# Patient Record
Sex: Male | Born: 1970 | Race: Black or African American | Hispanic: No | Marital: Married | Smoking: Current every day smoker
Health system: Southern US, Community
[De-identification: ages and names within clinical notes are randomized; demographics above are authoritative.]

## PROBLEM LIST (undated history)

## (undated) DIAGNOSIS — I10 Essential (primary) hypertension: Secondary | ICD-10-CM

## (undated) DIAGNOSIS — E785 Hyperlipidemia, unspecified: Secondary | ICD-10-CM

## (undated) DIAGNOSIS — G35 Multiple sclerosis: Secondary | ICD-10-CM

## (undated) HISTORY — DX: Essential (primary) hypertension: I10

## (undated) HISTORY — DX: Hyperlipidemia, unspecified: E78.5

---

## 2010-04-07 ENCOUNTER — Emergency Department: Payer: Self-pay | Admitting: Internal Medicine

## 2011-08-10 ENCOUNTER — Ambulatory Visit: Payer: Self-pay | Admitting: Family Medicine

## 2014-06-23 ENCOUNTER — Emergency Department: Payer: Self-pay | Admitting: Internal Medicine

## 2014-10-06 ENCOUNTER — Encounter: Payer: Self-pay | Admitting: Family Medicine

## 2014-10-06 ENCOUNTER — Ambulatory Visit (INDEPENDENT_AMBULATORY_CARE_PROVIDER_SITE_OTHER): Payer: BC Managed Care – PPO | Admitting: Family Medicine

## 2014-10-06 VITALS — BP 124/82 | HR 89 | Temp 98.3°F | Resp 16 | Ht 70.0 in | Wt 244.6 lb

## 2014-10-06 DIAGNOSIS — E1129 Type 2 diabetes mellitus with other diabetic kidney complication: Secondary | ICD-10-CM

## 2014-10-06 DIAGNOSIS — E669 Obesity, unspecified: Secondary | ICD-10-CM | POA: Diagnosis not present

## 2014-10-06 DIAGNOSIS — R809 Proteinuria, unspecified: Secondary | ICD-10-CM

## 2014-10-06 LAB — POCT GLYCOSYLATED HEMOGLOBIN (HGB A1C): Hemoglobin A1C: 4.8

## 2014-10-06 LAB — GLUCOSE, POCT (MANUAL RESULT ENTRY): POC Glucose: 102 mg/dl — AB (ref 70–99)

## 2014-10-06 MED ORDER — METFORMIN HCL 500 MG PO TABS: 500.0000 mg | ORAL_TABLET | Freq: Every day | ORAL | Status: DC

## 2014-10-06 NOTE — Progress Notes (Signed)
Name: Bill Perez   MRN: [id number]    DOB: 05/06/1970   Date:10/06/2014       Progress Note  Subjective  Chief Complaint  Chief Complaint  Patient presents with  . Diabetes    Diabetes He presents for his follow-up diabetic visit. He has type 2 diabetes mellitus. His disease course has been improving. Pertinent negatives for hypoglycemia include no dizziness, headaches, nervousness/anxiousness, seizures or tremors. Pertinent negatives for diabetes include no blurred vision, no chest pain, no polydipsia, no polyuria, no weakness and no weight loss. Diabetic complications include nephropathy. Risk factors for coronary artery disease include diabetes mellitus, hypertension, stress and obesity. Current diabetic treatment includes oral agent (monotherapy). He is compliant with treatment all of the time. His weight is decreasing steadily. He is following a generally unhealthy diet. When asked about meal planning, he reported none. He has not had a previous visit with a dietitian. He participates in exercise weekly. His breakfast blood glucose range is generally 90-110 mg/dl. An ACE inhibitor/angiotensin II receptor blocker is not being taken. He does not see a podiatrist.Eye exam is not current.    Obesity  Patient's had a long-standing problem with obesity. This is the primary reason apparently for his diabetes. This is much improved.   Past Medical History  Diagnosis Date  . Diabetes mellitus without complication   . Hyperlipidemia     History  Substance Use Topics  . Smoking status: Current Every Day Smoker  . Smokeless tobacco: Not on file  . Alcohol Use: 0.0 oz/week    0 Standard drinks or equivalent per week     Current outpatient prescriptions:  .  metFORMIN (GLUCOPHAGE) 500 MG tablet, , Disp: , Rfl: 0 .  ONE TOUCH ULTRA TEST test strip, , Disp: , Rfl: 0 .  ONETOUCH DELICA LANCETS 33G MISC, , Disp: , Rfl: 0  No Known Allergies  Review of Systems  Constitutional:  Negative for fever, chills and weight loss.  HENT: Negative for congestion, hearing loss, sore throat and tinnitus.   Eyes: Negative for blurred vision, double vision and redness.  Respiratory: Negative for cough, hemoptysis and shortness of breath.   Cardiovascular: Negative for chest pain, palpitations, orthopnea, claudication and leg swelling.  Gastrointestinal: Negative for heartburn, nausea, vomiting, diarrhea, constipation and blood in stool.  Genitourinary: Negative for dysuria, urgency, frequency and hematuria.  Musculoskeletal: Negative for myalgias, back pain, joint pain, falls and neck pain.  Skin: Negative for itching.  Neurological: Negative for dizziness, tingling, tremors, focal weakness, seizures, loss of consciousness, weakness and headaches.  Endo/Heme/Allergies: Negative for polydipsia. Does not bruise/bleed easily.  Psychiatric/Behavioral: Negative for depression and substance abuse. The patient is not nervous/anxious and does not have insomnia.      Objective  Filed Vitals:   10/06/14 1113  BP: 124/82  Pulse: 89  Temp: 98.3 F (36.8 C)  Resp: 16  Height:  (1.778 m)  Weight: 244 lb 9 oz (110.933 kg)  SpO2: 98%     Physical Exam  Constitutional: He is oriented to person, place, and time and well-developed, well-nourished, and in no distress.  HENT:  Head: Normocephalic.  Eyes: EOM are normal. Pupils are equal, round, and reactive to light.  Neck: Normal range of motion. Neck supple. No thyromegaly present.  Cardiovascular: Normal rate, regular rhythm and normal heart sounds.   No murmur heard. Pulmonary/Chest: Effort normal and breath sounds normal. No respiratory distress. He has no wheezes.  Abdominal: Soft. Bowel sounds are normal.  Musculoskeletal: Normal range of motion. He exhibits no edema.  Lymphadenopathy:    He has no cervical adenopathy.  Neurological: He is alert and oriented to person, place, and time. No cranial nerve deficit. Gait  normal. Coordination normal.  Skin: Skin is warm and dry. No rash noted.  Psychiatric: Affect and judgment normal.      Assessment & Plan  1. Type 2 diabetes mellitus with other diabetic kidney complication Greatly improved Refer to diabetic teaching nurse. Patient may be having a honeymoon period after the initial diagnosis of diabetes. We will therefore decrease his Glucophage from 500 mg twice a day to once daily and reassess in 1 month - POCT HgB A1C - POCT Glucose (CBG)  2. Obesity Slightly improved

## 2014-10-06 NOTE — Patient Instructions (Signed)
FU in 3 mo

## 2014-10-15 ENCOUNTER — Encounter: Payer: Self-pay | Admitting: *Deleted

## 2014-10-15 ENCOUNTER — Encounter: Payer: BC Managed Care – PPO | Attending: Family Medicine | Admitting: *Deleted

## 2014-10-15 VITALS — BP 124/90 | Ht 70.0 in | Wt 244.9 lb

## 2014-10-15 DIAGNOSIS — E119 Type 2 diabetes mellitus without complications: Secondary | ICD-10-CM | POA: Diagnosis present

## 2014-10-15 NOTE — Progress Notes (Signed)
Diabetes Self-Management Education  Visit Type: First/Initial  Appt. Start Time: 1405 Appt. End Time: 1505  10/15/2014  [name], identified by name and date of birth, is a 44 y.o. male with a diagnosis of Diabetes: Type 2.    ASSESSMENT Blood pressure 124/90, height  (1.778 m), weight 244 lb 14.4 oz (111.086 kg). Body mass index is 35.14 kg/(m^2).  Initial Visit Information: Are you currently following a meal plan?: Yes What type of meal plan do you follow?: limit strachy food; eat light meals Are you taking your medications as prescribed?: Yes Are you checking your feet?: No How often do you need to have someone help you when you read instructions, pamphlets, or other written materials from your doctor or pharmacy?: 1 - Never What is the last grade level you completed in school?: College 4 years  Psychosocial: Patient Belief/Attitude about Diabetes: Motivated to manage diabetes ("feel normal") Self-care barriers: None Self-management support: Friends, Doctor's office Patient Concerns: Nutrition/Meal planning, Medication, Monitoring, Healthy Lifestyle, Problem Solving, Glycemic Control, Weight Control Special Needs: None Preferred Learning Style: Auditory, Visual, Hands on Learning Readiness: Change in progress  Complications:  Last HgB A1C per patient/outside source: 9 mg/dL (Pt reports reading in March. Recent one was 4.8 % in MD office on 10/06/14) How often do you check your blood sugar?: 1-2 times/day (weekly) Fasting Blood glucose range (mg/dL): 40-981 Postprandial Blood glucose range (mg/dL): 19-147 Have you had a dilated eye exam in the past 12 months?: No ("never") Have you had a dental exam in the past 12 months?: Yes (08/2014)  Diet Intake: Breakfast: bacon and apple Snack (morning): apple Lunch: sandwich or grilled chicken or burger Snack (afternoon): chips Dinner: meat and vegetables, greens Snack (evening): fruit Beverage(s): water; drank 1  soda in last week  Exercise: Exercise: Light (walking) Light Exercise amount of time (min / week): 120  Individualized Plan for Diabetes Self-Management Training:  Learning Objective:  Patient will have a greater understanding of diabetes self-management.  Education Topics Reviewed with Patient Today: Definition of diabetes, type 1 and 2, and the diagnosis of diabetes, Factors that contribute to the development of diabetes, Explored patient's options for treatment of their diabetes Food label reading, portion sizes and measuring food., Role of diet in the treatment of diabetes and the relationship between the three main macronutrients and blood glucose level Role of exercise on diabetes management, blood pressure control and cardiac health. Reviewed patients medication for diabetes, action, purpose, timing of dose and side effects. Purpose and frequency of SMBG., Identified appropriate SMBG and/or A1C goals. Relationship between chronic complications and blood glucose control, Retinopathy and reason for yearly dilated eye exams Identified and addressed patients feelings and concerns about diabetes Review risk of smoking and offered smoking cessation  PATIENTS GOALS/Plan (Developed by the patient): Improve blood sugars Decrease medications Prevent diabetes complications Lose weight Lead a healthier lifestyle Become more fit Quit smoking  Plan:   Patient Instructions  Check blood sugars weekly per MD Exercise: Continue walking for  30  minutes  4 days a week and increase to 5 days a week Avoid sugar sweetened drinks (soda) Eat 3 meals day,  1-2 snacks a day Space meals 4-6 hours apart Make an eye doctor appointment Bring blood sugar records to MD appts Call back for classes or 1:1 appointment with a dietitian if needed  Expected Outcomes:  Demonstrated interest in learning. Expect positive outcomes  Education material provided:  General Meal Planning Guidelines  If problems  or questions, patient to contact team via:  Sharion Settler, RN, CCM, CDE [phone]  Future DSME appointment:  Patient doesn't want to return at this time for further education. He reports doing better and feels like he can manage on his own.

## 2014-10-15 NOTE — Patient Instructions (Addendum)
Check blood sugars weekly per MD Exercise: Continue walking for  30  minutes  4 days a week and increase to 5 days a week Avoid sugar sweetened drinks (soda) Eat 3 meals day,  1-2 snacks a day Space meals 4-6 hours apart Make an eye doctor appointment Bring blood sugar records to MD appts Call back for classes or 1:1 appointment with a dietitian if needed

## 2015-01-06 ENCOUNTER — Ambulatory Visit: Payer: BC Managed Care – PPO | Admitting: Family Medicine

## 2015-01-13 ENCOUNTER — Encounter: Payer: Self-pay | Admitting: Family Medicine

## 2015-01-13 ENCOUNTER — Ambulatory Visit (INDEPENDENT_AMBULATORY_CARE_PROVIDER_SITE_OTHER): Payer: BC Managed Care – PPO | Admitting: Family Medicine

## 2015-01-13 VITALS — BP 118/82 | HR 88 | Temp 98.7°F | Resp 16 | Ht 70.0 in | Wt 247.2 lb

## 2015-01-13 DIAGNOSIS — J0110 Acute frontal sinusitis, unspecified: Secondary | ICD-10-CM | POA: Diagnosis not present

## 2015-01-13 MED ORDER — AMOXICILLIN-POT CLAVULANATE 875-125 MG PO TABS: 1.0000 | ORAL_TABLET | Freq: Two times a day (BID) | ORAL | Status: DC

## 2015-01-13 NOTE — Progress Notes (Signed)
Name: Bill Perez   MRN: [id number]    DOB: Oct 08, 1970   Date:01/13/2015       Progress Note  Subjective  Chief Complaint  Chief Complaint  Patient presents with  . Sinusitis    facial presssure for last 3 days    Sinusitis This is a new problem. The current episode started in the past 7 days (3 days ago). There has been no fever. The pain is moderate. Associated symptoms include sinus pressure (pressure over the right eye) and a sore throat. Pertinent negatives include no chills, congestion, coughing or ear pain. Past treatments include oral decongestants (Alka-Seltzer.). The treatment provided moderate relief.   Past Medical History  Diagnosis Date  . Diabetes mellitus without complication (HCC)   . Hyperlipidemia     No past surgical history on file.  Family History  Problem Relation Age of Onset  . Hypertension Mother   . Hyperlipidemia Mother   . Stroke Father   . Diabetes Father     Social History   Social History  . Marital Status: Single    Spouse Name: N/A  . Number of Children: N/A  . Years of Education: N/A   Occupational History  . Not on file.   Social History Main Topics  . Smoking status: Current Every Day Smoker -- 0.50 packs/day for 25 years    Types: Cigarettes  . Smokeless tobacco: Never Used  . Alcohol Use: 2.4 oz/week    4 Glasses of wine per week  . Drug Use: Yes  . Sexual Activity: Not on file   Other Topics Concern  . Not on file   Social History Narrative     Current outpatient prescriptions:  .  aspirin 81 MG tablet, Take 81 mg by mouth daily., Disp: , Rfl:  .  metFORMIN (GLUCOPHAGE) 500 MG tablet, Take 1 tablet (500 mg total) by mouth daily with breakfast. (Patient taking differently: Take 250 mg by mouth daily with breakfast. ), Disp: 30 tablet, Rfl: 5 .  ONE TOUCH ULTRA TEST test strip, , Disp: , Rfl: 0 .  ONETOUCH DELICA LANCETS 33G MISC, , Disp: , Rfl: 0  No Known Allergies   Review of Systems  Constitutional:  Negative for fever and chills.  HENT: Positive for sinus pressure (pressure over the right eye) and sore throat. Negative for congestion and ear pain.   Respiratory: Negative for cough and sputum production.   Cardiovascular: Negative for chest pain.   Objective  Filed Vitals:   01/13/15 1044  BP: 118/82  Pulse: 88  Temp: 98.7 F (37.1 C)  Resp: 16  Height: 5\' 10"  (1.778 m)  Weight: 247 lb 4 oz (112.152 kg)  SpO2: 98%   Physical Exam  Constitutional: He is well-developed, well-nourished, and in no distress.  HENT:  Right Ear: Tympanic membrane and ear canal normal.  Left Ear: Tympanic membrane and ear canal normal.  Nose: Mucosal edema present. No sinus tenderness. Right sinus exhibits frontal sinus tenderness. Left sinus exhibits frontal sinus tenderness.  Mouth/Throat: No posterior oropharyngeal erythema.  Cardiovascular: Normal rate and regular rhythm.   Pulmonary/Chest: Effort normal and breath sounds normal.  Nursing note and vitals reviewed.  Assessment & Plan  1. Acute non-recurrent frontal sinusitis  - amoxicillin-clavulanate (AUGMENTIN) 875-125 MG tablet; Take 1 tablet by mouth 2 (two) times daily.  Dispense: 20 tablet; Refill: 0   Shamiya Demeritt Asad A[facility] 01/13/2015 11:25 AM

## 2015-03-02 ENCOUNTER — Ambulatory Visit: Payer: BC Managed Care – PPO | Admitting: Family Medicine

## 2015-03-03 ENCOUNTER — Encounter: Payer: Self-pay | Admitting: Family Medicine

## 2015-03-03 ENCOUNTER — Ambulatory Visit (INDEPENDENT_AMBULATORY_CARE_PROVIDER_SITE_OTHER): Payer: BC Managed Care – PPO | Admitting: Family Medicine

## 2015-03-03 VITALS — BP 122/68 | HR 92 | Temp 97.9°F | Resp 18 | Ht 70.0 in | Wt 246.4 lb

## 2015-03-03 DIAGNOSIS — E1169 Type 2 diabetes mellitus with other specified complication: Secondary | ICD-10-CM

## 2015-03-03 LAB — GLUCOSE, POCT (MANUAL RESULT ENTRY): POC GLUCOSE: 102 mg/dL — AB (ref 70–99)

## 2015-03-03 LAB — POCT GLYCOSYLATED HEMOGLOBIN (HGB A1C): HEMOGLOBIN A1C: 4.8

## 2015-03-03 MED ORDER — LISINOPRIL 5 MG PO TABS: 10.0000 mg | ORAL_TABLET | Freq: Every day | ORAL | Status: DC

## 2015-03-03 MED ORDER — FLUTICASONE PROPIONATE 50 MCG/ACT NA SUSP: 2.0000 | Freq: Every day | NASAL | Status: DC

## 2015-03-03 NOTE — Progress Notes (Signed)
Name: Bill Perez   MRN: [id number]    DOB: 1971-02-28   Date:03/03/2015       Progress Note  Subjective  Chief Complaint  Chief Complaint  Patient presents with  . Diabetes    4 month follow up    HPI   Diabetes  Patient presents for follow-up of diabetes which is present for several years. Is currently on a regimen of metformin 500 daily . Patient states he is compliant with their diet and exercise. There's been no hypoglycemic episodes and there is no polyuria polydipsia polyphagia. His average fasting glucoses been in the low around - with a high around - . There is no end organ disease.  Last diabetic eye exam was -.   Last visit with dietitian was -. Last microalbumin was obtained March of this year and was 50 . It is working    Past Medical History  Diagnosis Date  . Diabetes mellitus without complication (HCC)   . Hyperlipidemia     Social History  Substance Use Topics  . Smoking status: Current Every Day Smoker -- 0.50 packs/day for 25 years    Types: Cigarettes  . Smokeless tobacco: Never Used  . Alcohol Use: 2.4 oz/week    4 Glasses of wine per week     Current outpatient prescriptions:  .  aspirin 81 MG tablet, Take 81 mg by mouth daily., Disp: , Rfl:  .  metFORMIN (GLUCOPHAGE) 500 MG tablet, Take 1 tablet (500 mg total) by mouth daily with breakfast. (Patient taking differently: Take 250 mg by mouth daily with breakfast. ), Disp: 30 tablet, Rfl: 5 .  ONE TOUCH ULTRA TEST test strip, , Disp: , Rfl: 0 .  ONETOUCH DELICA LANCETS 33G MISC, , Disp: , Rfl: 0 .  amoxicillin-clavulanate (AUGMENTIN) 875-125 MG tablet, Take 1 tablet by mouth 2 (two) times daily., Disp: 20 tablet, Rfl: 0  No Known Allergies  Review of Systems  Constitutional: Negative for fever, chills and weight loss.  HENT: Negative for congestion, hearing loss, sore throat and tinnitus.   Eyes: Negative for blurred vision, double vision and redness.  Respiratory: Negative for cough,  hemoptysis and shortness of breath.   Cardiovascular: Negative for chest pain, palpitations, orthopnea, claudication and leg swelling.  Gastrointestinal: Negative for heartburn, nausea, vomiting, diarrhea, constipation and blood in stool.  Genitourinary: Negative for dysuria, urgency, frequency and hematuria.  Musculoskeletal: Negative for myalgias, back pain, joint pain, falls and neck pain.  Skin: Negative for itching.  Neurological: Negative for dizziness, tingling, tremors, focal weakness, seizures, loss of consciousness, weakness and headaches.  Endo/Heme/Allergies: Does not bruise/bleed easily.  Psychiatric/Behavioral: Negative for depression and substance abuse. The patient is not nervous/anxious and does not have insomnia.      Objective  Filed Vitals:   03/03/15 1009  BP: 122/68  Pulse: 92  Temp: 97.9 F (36.6 C)  TempSrc: Oral  Resp: 18  Height: 5\' 10"  (1.778 m)  Weight: 246 lb 6.4 oz (111.766 kg)  SpO2: 96%     Physical Exam  Constitutional: He is oriented to person, place, and time and well-developed, well-nourished, and in no distress.  HENT:  Head: Normocephalic.  Eyes: EOM are normal. Pupils are equal, round, and reactive to light.  Neck: Normal range of motion. Neck supple. No thyromegaly present.  Cardiovascular: Normal rate, regular rhythm and normal heart sounds.   No murmur heard. Pulmonary/Chest: Effort normal and breath sounds normal. No respiratory distress. He has no wheezes.  Abdominal: Soft.  Bowel sounds are normal.  Musculoskeletal: Normal range of motion. He exhibits no edema.  Lymphadenopathy:    He has no cervical adenopathy.  Neurological: He is alert and oriented to person, place, and time. No cranial nerve deficit. Gait normal. Coordination normal.  Skin: Skin is warm and dry. No rash noted.  Psychiatric: Affect and judgment normal.      Assessment & Plan  1. Type 2 diabetes mellitus with other specified complication  (HCC) Discontinue metformin - POCT HgB A1C - POCT Glucose (CBG) - Comprehensive Metabolic Panel (CMET)

## 2015-03-17 ENCOUNTER — Telehealth: Payer: Self-pay | Admitting: Family Medicine

## 2015-03-17 NOTE — Telephone Encounter (Signed)
PT SAID THAT THE DR TOOK HIM OFF ALL HIS MEDICATION AND A FEW DAYS AFTER THAT HIS FEET ARE GOING NUMB. PLEASE ADVISE.

## 2015-03-18 ENCOUNTER — Telehealth: Payer: Self-pay | Admitting: Emergency Medicine

## 2015-03-18 NOTE — Telephone Encounter (Signed)
Patient stated that it feels likes pins and needles since stopping medication. Go back to Metformin every other day and return in 1 month.

## 2015-03-19 NOTE — Telephone Encounter (Signed)
Patient notified to take Metformin every other day and return in 1 month for recheck

## 2016-03-15 ENCOUNTER — Ambulatory Visit (INDEPENDENT_AMBULATORY_CARE_PROVIDER_SITE_OTHER): Payer: BC Managed Care – PPO | Admitting: Family Medicine

## 2016-03-15 ENCOUNTER — Encounter: Payer: Self-pay | Admitting: Family Medicine

## 2016-03-15 VITALS — BP 128/84 | HR 100 | Temp 98.2°F | Resp 18 | Ht 70.0 in | Wt 262.3 lb

## 2016-03-15 DIAGNOSIS — S39011A Strain of muscle, fascia and tendon of abdomen, initial encounter: Secondary | ICD-10-CM | POA: Diagnosis not present

## 2016-03-15 DIAGNOSIS — E1169 Type 2 diabetes mellitus with other specified complication: Secondary | ICD-10-CM | POA: Diagnosis not present

## 2016-03-15 LAB — GLUCOSE, POCT (MANUAL RESULT ENTRY): POC Glucose: 92 mg/dl (ref 70–99)

## 2016-03-15 LAB — POCT GLYCOSYLATED HEMOGLOBIN (HGB A1C): HEMOGLOBIN A1C: 5.2

## 2016-03-15 MED ORDER — NAPROXEN 500 MG PO TABS: 500.0000 mg | ORAL_TABLET | Freq: Two times a day (BID) | ORAL | 0 refills | Status: DC

## 2016-03-15 MED ORDER — TIZANIDINE HCL 2 MG PO TABS: 2.0000 mg | ORAL_TABLET | Freq: Three times a day (TID) | ORAL | 0 refills | Status: DC | PRN

## 2016-03-15 NOTE — Progress Notes (Signed)
Name: Bill DubsRandy E Pillsbury   MRN: [id number]    DOB: 08/06/1970   Date:03/15/2016       Progress Note  Subjective  Chief Complaint  Chief Complaint  Patient presents with  . Diabetes    follow up  This patient is usually followed by Dr. Thana AtesMorrisey, new to me  Diabetes  He presents for his follow-up diabetic visit. He has type 2 diabetes mellitus. His disease course has been improving. There are no hypoglycemic associated symptoms. Pertinent negatives for diabetes include no fatigue, no polydipsia and no polyuria. Current diabetic treatment includes diet. He is following a generally healthy diet. He rarely (no exercise at this time, but used to walk every day) participates in exercise. Frequency home blood tests: does not check blood glucose. An ACE inhibitor/angiotensin II receptor blocker is not being taken.    Abdominal Wall Numbness: Pt. presents for 10 day history of numbness and tingling in his anterior left sided abdominal wall, radiating to the the left flank and left lower back, feesl that the entire area is numb, started after he raked a lot of leaves doing his mom's yard. He has no abdominal pain on the inside, just felt to be more on the surface.  Also feels a little tightness in his anterior abdomen when he drives, has taken Ibuprofen in the past few days.   Past Medical History:  Diagnosis Date  . Diabetes mellitus without complication (HCC)   . Hyperlipidemia     No past surgical history on file.  Family History  Problem Relation Age of Onset  . Hypertension Mother   . Hyperlipidemia Mother   . Stroke Father   . Diabetes Father     Social History   Social History  . Marital status: Single    Spouse name: N/A  . Number of children: N/A  . Years of education: N/A   Occupational History  . Not on file.   Social History Main Topics  . Smoking status: Current Every Day Smoker    Packs/day: 0.50    Years: 25.00    Types: Cigarettes  . Smokeless tobacco: Never Used   . Alcohol use 2.4 oz/week    4 Glasses of wine per week  . Drug use:   . Sexual activity: Not on file   Other Topics Concern  . Not on file   Social History Narrative  . No narrative on file     Current Outpatient Prescriptions:  .  aspirin 81 MG tablet, Take 81 mg by mouth daily., Disp: , Rfl:  .  ONE TOUCH ULTRA TEST test strip, , Disp: , Rfl: 0 .  ONETOUCH DELICA LANCETS 33G MISC, , Disp: , Rfl: 0  No Known Allergies   Review of Systems  Constitutional: Negative for chills, fatigue, fever and malaise/fatigue.  Gastrointestinal: Positive for abdominal pain. Negative for nausea and vomiting.  Endo/Heme/Allergies: Negative for polydipsia.    Objective  Vitals:   03/15/16 1438  BP: 128/84  Pulse: 100  Resp: 18  Temp: 98.2 F (36.8 C)  TempSrc: Oral  SpO2: 97%  Weight: 262 lb 4.8 oz (119 kg)  Height: 5\' 10"  (1.778 m)    Physical Exam  Constitutional: He is oriented to person, place, and time and well-developed, well-nourished, and in no distress.  Abdominal: Soft. Bowel sounds are normal. There is no tenderness.    Musculoskeletal:       Lumbar back: He exhibits tenderness, pain and spasm.  Back:  Neurological: He is alert and oriented to person, place, and time.  Psychiatric: Mood, memory, affect and judgment normal.  Nursing note and vitals reviewed.     Assessment & Plan  1. Type 2 diabetes mellitus with other specified complication, without long-term current use of insulin (HCC) A1c is 5.2%, point-of-care glucose is 92 mg/dL. Very well controlled diabetes, no pharmacotherapy indicated - POCT HgB A1C - POCT Glucose (CBG)  2. Strain of abdominal wall, initial encounter Based on history and patient's symptoms, this is likely a strain of the abdominal wall and left lower back muscles, started after he raked leaves. We'll start on muscle relaxant and anti-inflammatory medication, tries to rest and use heating pad frequently, reassess if no  improvement - tiZANidine (ZANAFLEX) 2 MG tablet; Take 1 tablet (2 mg total) by mouth every 8 (eight) hours as needed for muscle spasms.  Dispense: 21 tablet; Refill: 0 - naproxen (NAPROSYN) 500 MG tablet; Take 1 tablet (500 mg total) by mouth 2 (two) times daily with a meal.  Dispense: 14 tablet; Refill: 0   Kenan Moodie Asad A[facility] 03/15/2016 2:45 PM

## 2016-03-15 NOTE — Progress Notes (Signed)
52

## 2016-05-30 ENCOUNTER — Encounter: Payer: Self-pay | Admitting: Family Medicine

## 2016-05-30 ENCOUNTER — Ambulatory Visit (INDEPENDENT_AMBULATORY_CARE_PROVIDER_SITE_OTHER): Payer: BC Managed Care – PPO | Admitting: Family Medicine

## 2016-05-30 VITALS — BP 126/82 | HR 110 | Temp 98.6°F | Resp 18 | Ht 70.0 in | Wt 250.8 lb

## 2016-05-30 DIAGNOSIS — R809 Proteinuria, unspecified: Secondary | ICD-10-CM | POA: Diagnosis not present

## 2016-05-30 DIAGNOSIS — E1165 Type 2 diabetes mellitus with hyperglycemia: Secondary | ICD-10-CM

## 2016-05-30 DIAGNOSIS — IMO0002 Reserved for concepts with insufficient information to code with codable children: Secondary | ICD-10-CM

## 2016-05-30 DIAGNOSIS — R350 Frequency of micturition: Secondary | ICD-10-CM | POA: Diagnosis not present

## 2016-05-30 DIAGNOSIS — E1129 Type 2 diabetes mellitus with other diabetic kidney complication: Secondary | ICD-10-CM | POA: Diagnosis not present

## 2016-05-30 LAB — POCT URINALYSIS DIPSTICK
Bilirubin, UA: NEGATIVE
GLUCOSE UA: 2000
KETONES UA: NEGATIVE
Leukocytes, UA: NEGATIVE
Nitrite, UA: NEGATIVE
Protein, UA: 30
SPEC GRAV UA: 1.01
Urobilinogen, UA: NEGATIVE
pH, UA: 5

## 2016-05-30 LAB — POCT UA - MICROALBUMIN: MICROALBUMIN (UR) POC: 100 mg/L

## 2016-05-30 LAB — GLUCOSE, POCT (MANUAL RESULT ENTRY): POC Glucose: 415 mg/dl — AB (ref 70–99)

## 2016-05-30 LAB — POCT GLYCOSYLATED HEMOGLOBIN (HGB A1C): Hemoglobin A1C: 8.9

## 2016-05-30 MED ORDER — SITAGLIPTIN PHOS-METFORMIN HCL 50-500 MG PO TABS: 1.0000 | ORAL_TABLET | Freq: Two times a day (BID) | ORAL | 0 refills | Status: DC

## 2016-05-30 MED ORDER — LISINOPRIL 2.5 MG PO TABS: 2.5000 mg | ORAL_TABLET | Freq: Every day | ORAL | 0 refills | Status: DC

## 2016-05-30 NOTE — Progress Notes (Signed)
Name: Bill Perez   MRN: [id number]    DOB: 06-28-1970   Date:05/30/2016       Progress Note  Subjective  Chief Complaint  Chief Complaint  Patient presents with  . Blood Sugar Problem    Frequent urination, thirsty, blurred vision  . Spasms    low back     Diabetes  He presents for his follow-up diabetic visit. He has type 2 diabetes mellitus. His disease course has been worsening. Associated symptoms include blurred vision, fatigue, polydipsia and polyuria. Symptoms are worsening. He is following a generally healthy (has been eating more bread) diet. An ACE inhibitor/angiotensin II receptor blocker is not being taken.      Past Medical History:  Diagnosis Date  . Hyperlipidemia     No past surgical history on file.  Family History  Problem Relation Age of Onset  . Hypertension Mother   . Hyperlipidemia Mother   . Stroke Father   . Diabetes Father     Social History   Social History  . Marital status: Single    Spouse name: N/A  . Number of children: N/A  . Years of education: N/A   Occupational History  . Not on file.   Social History Main Topics  . Smoking status: Current Every Day Smoker    Packs/day: 0.50    Years: 25.00    Types: Cigarettes  . Smokeless tobacco: Never Used  . Alcohol use 2.4 oz/week    4 Glasses of wine per week  . Drug use: Yes  . Sexual activity: Not on file   Other Topics Concern  . Not on file   Social History Narrative  . No narrative on file     Current Outpatient Prescriptions:  .  aspirin 81 MG tablet, Take 81 mg by mouth daily., Disp: , Rfl:  .  lisinopril (PRINIVIL,ZESTRIL) 2.5 MG tablet, Take 1 tablet (2.5 mg total) by mouth daily., Disp: 90 tablet, Rfl: 0 .  ONE TOUCH ULTRA TEST test strip, , Disp: , Rfl: 0 .  ONETOUCH DELICA LANCETS 33G MISC, , Disp: , Rfl: 0 .  sitaGLIPtin-metformin (JANUMET) 50-500 MG tablet, Take 1 tablet by mouth 2 (two) times daily with a meal., Disp: 180 tablet, Rfl: 0  No Known  Allergies   Review of Systems  Constitutional: Positive for fatigue.  Eyes: Positive for blurred vision.  Endo/Heme/Allergies: Positive for polydipsia.      Objective  Vitals:   05/30/16 1118  BP: 126/82  Pulse: (!) 110  Resp: 18  Temp: 98.6 F (37 C)  SpO2: 95%  Weight: 250 lb 12.8 oz (113.8 kg)  Height: 5\' 10"  (1.778 m)    Physical Exam  Constitutional: He is oriented to person, place, and time and well-developed, well-nourished, and in no distress.  HENT:  Head: Normocephalic and atraumatic.  Cardiovascular: Normal rate, regular rhythm and normal heart sounds.   No murmur heard. Pulmonary/Chest: Effort normal and breath sounds normal. He has no wheezes.  Abdominal: Soft. Bowel sounds are normal. There is no tenderness.  Neurological: He is alert and oriented to person, place, and time.  Psychiatric: Mood, memory, affect and judgment normal.  Nursing note and vitals reviewed.      Recent Results (from the past 2160 hour(s))  POCT HgB A1C     Status: None   Collection Time: 03/15/16  2:46 PM  Result Value Ref Range   Hemoglobin A1C 5.2   POCT Glucose (CBG)     Status:  None   Collection Time: 03/15/16  2:46 PM  Result Value Ref Range   POC Glucose 92 70 - 99 mg/dl  POCT Glucose (CBG)     Status: Abnormal   Collection Time: 05/30/16 10:55 AM  Result Value Ref Range   POC Glucose 415 (A) 70 - 99 mg/dl  POCT urinalysis dipstick     Status: None   Collection Time: 05/30/16 10:57 AM  Result Value Ref Range   Color, UA yellow    Clarity, UA clear    Glucose, UA 2,000    Bilirubin, UA negative    Ketones, UA negative    Spec Grav, UA 1.010    Blood, UA trace    pH, UA 5.0    Protein, UA 30    Urobilinogen, UA negative    Nitrite, UA negative    Leukocytes, UA Negative Negative  POCT UA - Microalbumin     Status: Abnormal   Collection Time: 05/30/16 10:59 AM  Result Value Ref Range   Microalbumin Ur, POC 100 mg/L   Creatinine, POC  mg/dL    Albumin/Creatinine Ratio, Urine, POC    POCT HgB A1C     Status: None   Collection Time: 05/30/16 11:23 AM  Result Value Ref Range   Hemoglobin A1C 8.9      Assessment & Plan  1. Uncontrolled type 2 diabetes mellitus with microalbuminuria, without long-term current use of insulin (HCC) Point-of-care A1c is 8.9%, poorly controlled Diabetes, suspect decreased insulin sensitivity due to obesity coupled with dietary indiscretion, start on Janumet, Lisinopril for renal protection, and check FLP - POCT Glucose (CBG) - POCT UA - Microalbumin - POCT HgB A1C - sitaGLIPtin-metformin (JANUMET) 50-500 MG tablet; Take 1 tablet by mouth 2 (two) times daily with a meal.  Dispense: 180 tablet; Refill: 0 - lisinopril (PRINIVIL,ZESTRIL) 2.5 MG tablet; Take 1 tablet (2.5 mg total) by mouth daily.  Dispense: 90 tablet; Refill: 0 - COMPLETE METABOLIC PANEL WITH GFR - Lipid panel  2. Frequent urination  - POCT urinalysis dipstick    Tyrann Donaho Asad A. [facility] 05/30/2016 7:20 PM

## 2016-05-31 LAB — COMPLETE METABOLIC PANEL WITH GFR
ALBUMIN: 4.1 g/dL (ref 3.6–5.1)
ALK PHOS: 123 U/L — AB (ref 40–115)
ALT: 23 U/L (ref 9–46)
AST: 17 U/L (ref 10–40)
BILIRUBIN TOTAL: 0.8 mg/dL (ref 0.2–1.2)
BUN: 16 mg/dL (ref 7–25)
CO2: 26 mmol/L (ref 20–31)
CREATININE: 1.31 mg/dL (ref 0.60–1.35)
Calcium: 9.2 mg/dL (ref 8.6–10.3)
Chloride: 100 mmol/L (ref 98–110)
GFR, EST NON AFRICAN AMERICAN: 65 mL/min (ref >=60)
GFR, Est African American: 75 mL/min (ref >=60)
GLUCOSE: 304 mg/dL — AB (ref 65–99)
Potassium: 4.9 mmol/L (ref 3.5–5.3)
SODIUM: 136 mmol/L (ref 135–146)
TOTAL PROTEIN: 8.1 g/dL (ref 6.1–8.1)

## 2016-05-31 LAB — LIPID PANEL
Cholesterol: 182 mg/dL (ref <200)
HDL: 23 mg/dL — AB (ref >40)
Total CHOL/HDL Ratio: 7.9 Ratio — ABNORMAL HIGH (ref <5.0)
Triglycerides: 537 mg/dL — ABNORMAL HIGH (ref <150)

## 2016-06-27 ENCOUNTER — Encounter: Payer: Self-pay | Admitting: Family Medicine

## 2016-06-27 ENCOUNTER — Ambulatory Visit (INDEPENDENT_AMBULATORY_CARE_PROVIDER_SITE_OTHER): Payer: BC Managed Care – PPO | Admitting: Family Medicine

## 2016-06-27 VITALS — BP 124/70 | HR 95 | Temp 98.8°F | Resp 18 | Ht 70.0 in | Wt 246.9 lb

## 2016-06-27 DIAGNOSIS — E1129 Type 2 diabetes mellitus with other diabetic kidney complication: Secondary | ICD-10-CM

## 2016-06-27 DIAGNOSIS — E785 Hyperlipidemia, unspecified: Secondary | ICD-10-CM | POA: Insufficient documentation

## 2016-06-27 DIAGNOSIS — E1165 Type 2 diabetes mellitus with hyperglycemia: Secondary | ICD-10-CM | POA: Diagnosis not present

## 2016-06-27 DIAGNOSIS — E781 Pure hyperglyceridemia: Secondary | ICD-10-CM

## 2016-06-27 DIAGNOSIS — IMO0002 Reserved for concepts with insufficient information to code with codable children: Secondary | ICD-10-CM

## 2016-06-27 DIAGNOSIS — R809 Proteinuria, unspecified: Secondary | ICD-10-CM | POA: Diagnosis not present

## 2016-06-27 LAB — GLUCOSE, POCT (MANUAL RESULT ENTRY): POC Glucose: 80 mg/dl (ref 70–99)

## 2016-06-27 MED ORDER — ROSUVASTATIN CALCIUM 5 MG PO TABS: 5.0000 mg | ORAL_TABLET | Freq: Every day | ORAL | 0 refills | Status: DC

## 2016-06-27 MED ORDER — ROSUVASTATIN CALCIUM 5 MG PO TABS: 5.0000 mg | ORAL_TABLET | Freq: Every day | ORAL | 2 refills | Status: DC

## 2016-06-27 MED ORDER — ICOSAPENT ETHYL 1 G PO CAPS: 2.0000 | ORAL_CAPSULE | Freq: Two times a day (BID) | ORAL | 2 refills | Status: DC

## 2016-06-27 MED ORDER — SITAGLIPTIN PHOS-METFORMIN HCL 50-500 MG PO TABS: 1.0000 | ORAL_TABLET | Freq: Two times a day (BID) | ORAL | 2 refills | Status: DC

## 2016-06-27 MED ORDER — SITAGLIPTIN PHOS-METFORMIN HCL 50-500 MG PO TABS: 1.0000 | ORAL_TABLET | Freq: Two times a day (BID) | ORAL | 0 refills | Status: DC

## 2016-06-27 MED ORDER — ICOSAPENT ETHYL 1 G PO CAPS: 2.0000 | ORAL_CAPSULE | Freq: Two times a day (BID) | ORAL | 0 refills | Status: DC

## 2016-06-27 NOTE — Progress Notes (Signed)
Name: Bill Perez   MRN: [id number]    DOB: April 02, 1971   Date:06/27/2016       Progress Note  Subjective  Chief Complaint  Chief Complaint  Patient presents with  . Diabetes    1  month follow up after starting back on medication  . Follow-up    discuss abnormal labs for cholesterol    Diabetes  He presents for his follow-up diabetic visit. He has type 2 diabetes mellitus. His disease course has been improving. Pertinent negatives for diabetes include no fatigue, no foot paresthesias, no polydipsia, no polyphagia and no polyuria. Current diabetic treatment includes oral agent (dual therapy). His weight is stable. He is following a diabetic diet. He monitors blood glucose at home 3-4 x per week. His breakfast blood glucose range is generally 110-130 mg/dl. An ACE inhibitor/angiotensin II receptor blocker is being taken.  Hyperlipidemia  This is a new problem. The problem is uncontrolled. Recent lipid tests were reviewed and are high. Exacerbating diseases include diabetes and obesity. He is currently on no antihyperlipidemic treatment.     Past Medical History:  Diagnosis Date  . Hyperlipidemia     No past surgical history on file.  Family History  Problem Relation Age of Onset  . Hypertension Mother   . Hyperlipidemia Mother   . Stroke Father   . Diabetes Father     Social History   Social History  . Marital status: Single    Spouse name: N/A  . Number of children: N/A  . Years of education: N/A   Occupational History  . Not on file.   Social History Main Topics  . Smoking status: Current Every Day Smoker    Packs/day: 0.50    Years: 25.00    Types: Cigarettes  . Smokeless tobacco: Never Used  . Alcohol use 2.4 oz/week    4 Glasses of wine per week  . Drug use: Yes  . Sexual activity: Not on file   Other Topics Concern  . Not on file   Social History Narrative  . No narrative on file     Current Outpatient Prescriptions:  .  aspirin 81 MG tablet,  Take 81 mg by mouth daily., Disp: , Rfl:  .  lisinopril (PRINIVIL,ZESTRIL) 2.5 MG tablet, Take 1 tablet (2.5 mg total) by mouth daily., Disp: 90 tablet, Rfl: 0 .  ONE TOUCH ULTRA TEST test strip, , Disp: , Rfl: 0 .  ONETOUCH DELICA LANCETS 56L MISC, , Disp: , Rfl: 0 .  sitaGLIPtin-metformin (JANUMET) 50-500 MG tablet, Take 1 tablet by mouth 2 (two) times daily with a meal., Disp: 180 tablet, Rfl: 0 .  Blood Glucose Monitoring Suppl (ONE TOUCH ULTRA MINI) w/Device KIT, See admin instructions., Disp: , Rfl: 0  No Known Allergies   Review of Systems  Constitutional: Negative for fatigue.  Endo/Heme/Allergies: Negative for polydipsia and polyphagia.      Objective  Vitals:   06/27/16 1529  BP: 124/70  Pulse: 95  Resp: 18  Temp: 98.8 F (37.1 C)  TempSrc: Oral  SpO2: 97%  Weight: 246 lb 14.4 oz (112 kg)  Height: 5' 10"  (1.778 m)    Physical Exam  Constitutional: He is oriented to person, place, and time and well-developed, well-nourished, and in no distress.  HENT:  Head: Normocephalic and atraumatic.  Cardiovascular: Normal rate, regular rhythm and normal heart sounds.   No murmur heard. Pulmonary/Chest: Effort normal and breath sounds normal. He has no wheezes.  Abdominal: Soft. Bowel  sounds are normal.  Musculoskeletal: He exhibits no edema.  Neurological: He is alert and oriented to person, place, and time.  Psychiatric: Mood, memory, affect and judgment normal.  Nursing note and vitals reviewed.    Assessment & Plan  1. Hypertriglyceridemia Obtain workup for elevated triglycerides, start on Vascepa - TSH - Icosapent Ethyl (VASCEPA) 1 g CAPS; Take 2 capsules by mouth 2 (two) times daily after a meal.  Dispense: 120 capsule; Refill: 2  2. Uncontrolled type 2 diabetes mellitus with microalbuminuria, without long-term current use of insulin (HCC)  - Blood Glucose Monitoring Suppl (ONE TOUCH ULTRA MINI) w/Device KIT; See admin instructions.; Refill: 0 - POCT  Glucose (CBG) - rosuvastatin (CRESTOR) 5 MG tablet; Take 1 tablet (5 mg total) by mouth daily.  Dispense: 30 tablet; Refill: 2 - sitaGLIPtin-metformin (JANUMET) 50-500 MG tablet; Take 1 tablet by mouth 2 (two) times daily with a meal.  Dispense: 60 tablet; Refill: 2   Sanav Remer Asad A. [facility] 06/27/2016 3:46 PM

## 2016-06-28 LAB — TSH: TSH: 0.49 mIU/L (ref 0.40–4.50)

## 2016-08-25 ENCOUNTER — Ambulatory Visit: Payer: BC Managed Care – PPO | Admitting: Family Medicine

## 2016-09-08 ENCOUNTER — Other Ambulatory Visit: Payer: Self-pay | Admitting: Family Medicine

## 2016-09-08 DIAGNOSIS — R809 Proteinuria, unspecified: Principal | ICD-10-CM

## 2016-09-08 DIAGNOSIS — IMO0002 Reserved for concepts with insufficient information to code with codable children: Secondary | ICD-10-CM

## 2016-09-08 DIAGNOSIS — E1165 Type 2 diabetes mellitus with hyperglycemia: Principal | ICD-10-CM

## 2016-09-08 DIAGNOSIS — E1129 Type 2 diabetes mellitus with other diabetic kidney complication: Secondary | ICD-10-CM

## 2016-09-13 ENCOUNTER — Encounter: Payer: Self-pay | Admitting: Family Medicine

## 2016-09-13 ENCOUNTER — Ambulatory Visit (INDEPENDENT_AMBULATORY_CARE_PROVIDER_SITE_OTHER): Payer: BC Managed Care – PPO | Admitting: Family Medicine

## 2016-09-13 VITALS — BP 123/69 | HR 110 | Temp 97.9°F | Resp 17 | Ht 70.0 in | Wt 233.4 lb

## 2016-09-13 DIAGNOSIS — R809 Proteinuria, unspecified: Secondary | ICD-10-CM

## 2016-09-13 DIAGNOSIS — E781 Pure hyperglyceridemia: Secondary | ICD-10-CM | POA: Diagnosis not present

## 2016-09-13 DIAGNOSIS — E1129 Type 2 diabetes mellitus with other diabetic kidney complication: Secondary | ICD-10-CM

## 2016-09-13 LAB — LIPID PANEL
CHOL/HDL RATIO: 4 ratio (ref <5.0)
CHOLESTEROL: 104 mg/dL (ref <200)
HDL: 26 mg/dL — AB (ref >40)
LDL Cholesterol: 56 mg/dL (ref <100)
TRIGLYCERIDES: 109 mg/dL (ref <150)
VLDL: 22 mg/dL (ref <30)

## 2016-09-13 LAB — GLUCOSE, POCT (MANUAL RESULT ENTRY): POC GLUCOSE: 90 mg/dL (ref 70–99)

## 2016-09-13 LAB — POCT GLYCOSYLATED HEMOGLOBIN (HGB A1C): Hemoglobin A1C: 4.9

## 2016-09-13 MED ORDER — METFORMIN HCL 500 MG PO TABS
500.0000 mg | ORAL_TABLET | Freq: Two times a day (BID) | ORAL | 0 refills | Status: DC
Start: 2016-09-13 — End: 2017-01-19

## 2016-09-13 NOTE — Progress Notes (Signed)
Name: Bill Perez   MRN: [id number]    DOB: [birth year]   Date:09/13/2016       Progress Note  Subjective  Chief Complaint  Chief Complaint  Patient presents with  . Follow-up    2 mo    Diabetes  He presents for his follow-up diabetic visit. He has type 2 diabetes mellitus. His disease course has been improving. There are no hypoglycemic associated symptoms. Pertinent negatives for diabetes include no blurred vision, no chest pain, no fatigue, no foot paresthesias and no polyuria. Symptoms are worsening. Current diabetic treatment includes oral agent (dual therapy). He is compliant with treatment all of the time. He is following a diabetic diet. He monitors blood glucose at home 1-2 x per day. His breakfast blood glucose range is generally 110-130 mg/dl. An ACE inhibitor/angiotensin II receptor blocker is being taken.  Hyperlipidemia  This is a chronic problem. The problem is uncontrolled. Recent lipid tests were reviewed and are high (elevated Triglycerides.). Pertinent negatives include no chest pain, myalgias or shortness of breath. Current antihyperlipidemic treatment includes statins.      Past Medical History:  Diagnosis Date  . Hyperlipidemia     History reviewed. No pertinent surgical history.  Family History  Problem Relation Age of Onset  . Hypertension Mother   . Hyperlipidemia Mother   . Stroke Father   . Diabetes Father     Social History   Social History  . Marital status: Single    Spouse name: N/A  . Number of children: N/A  . Years of education: N/A   Occupational History  . Not on file.   Social History Main Topics  . Smoking status: Current Every Day Smoker    Packs/day: 0.50    Years: 25.00    Types: Cigarettes  . Smokeless tobacco: Never Used  . Alcohol use 2.4 oz/week    4 Glasses of wine per week  . Drug use: Yes  . Sexual activity: Not on file   Other Topics Concern  . Not on file   Social History Narrative  . No narrative on file      Current Outpatient Prescriptions:  .  aspirin 81 MG tablet, Take 81 mg by mouth daily., Disp: , Rfl:  .  Blood Glucose Monitoring Suppl (ONE TOUCH ULTRA MINI) w/Device KIT, See admin instructions., Disp: , Rfl: 0 .  Icosapent Ethyl (VASCEPA) 1 g CAPS, Take 2 capsules by mouth 2 (two) times daily after a meal., Disp: 120 capsule, Rfl: 2 .  lisinopril (PRINIVIL,ZESTRIL) 2.5 MG tablet, take 1 tablet by mouth once daily, Disp: 90 tablet, Rfl: 0 .  ONE TOUCH ULTRA TEST test strip, , Disp: , Rfl: 0 .  ONETOUCH DELICA LANCETS 27M MISC, , Disp: , Rfl: 0 .  rosuvastatin (CRESTOR) 5 MG tablet, Take 1 tablet (5 mg total) by mouth daily., Disp: 30 tablet, Rfl: 2 .  sitaGLIPtin-metformin (JANUMET) 50-500 MG tablet, Take 1 tablet by mouth 2 (two) times daily with a meal., Disp: 60 tablet, Rfl: 2  No Known Allergies   Review of Systems  Constitutional: Negative for fatigue.  Eyes: Negative for blurred vision.  Respiratory: Negative for shortness of breath.   Cardiovascular: Negative for chest pain.  Musculoskeletal: Negative for myalgias.    Objective  Vitals:   09/13/16 0951  BP: 123/69  Pulse: (!) 110  Resp: 17  Temp: 97.9 F (36.6 C)  TempSrc: Oral  SpO2: 96%  Weight: 233 lb 6.4 oz (105.9 kg)  Height: 5' 10"  (1.778 m)    Physical Exam  Constitutional: He is oriented to person, place, and time and well-developed, well-nourished, and in no distress.  HENT:  Head: Normocephalic and atraumatic.  Cardiovascular: Normal rate, regular rhythm and normal heart sounds.   No murmur heard. Pulmonary/Chest: Effort normal and breath sounds normal. He has no wheezes.  Abdominal: Soft. Bowel sounds are normal. There is no tenderness.  Neurological: He is alert and oriented to person, place, and time.  Psychiatric: Mood, memory, affect and judgment normal.  Nursing note and vitals reviewed.    Assessment & Plan  1. Controlled type 2 diabetes mellitus with microalbuminuria, without  long-term current use of insulin (HCC)   A1c is 4.9%, well-controlled diabetes, DC Januvia, continue with metformin 500 mg twice a day for now, advised to check blood glucose regularly. Obtain urine microalbumin level. - POCT HgB A1C - POCT Glucose (CBG) - metFORMIN (GLUCOPHAGE) 500 MG tablet; Take 1 tablet (500 mg total) by mouth 2 (two) times daily with a meal.  Dispense: 180 tablet; Refill: 0 - Urine Microalbumin w/creat. ratio  2. Hypertriglyceridemia Elevated triglycerides, repeat today, continue on Vascepa - Lipid panel  Bill Perez [facility] 09/13/2016 10:13 AM

## 2016-09-14 LAB — MICROALBUMIN / CREATININE URINE RATIO
Creatinine, Urine: 237 mg/dL (ref 20–370)
MICROALB UR: 22.4 mg/dL
Microalb Creat Ratio: 95 mcg/mg creat — ABNORMAL HIGH (ref <30)

## 2017-01-12 ENCOUNTER — Other Ambulatory Visit: Payer: Self-pay | Admitting: Family Medicine

## 2017-01-12 DIAGNOSIS — R809 Proteinuria, unspecified: Principal | ICD-10-CM

## 2017-01-12 DIAGNOSIS — E1129 Type 2 diabetes mellitus with other diabetic kidney complication: Secondary | ICD-10-CM

## 2017-01-19 ENCOUNTER — Encounter: Payer: Self-pay | Admitting: Family Medicine

## 2017-01-19 ENCOUNTER — Ambulatory Visit (INDEPENDENT_AMBULATORY_CARE_PROVIDER_SITE_OTHER): Payer: BC Managed Care – PPO | Admitting: Family Medicine

## 2017-01-19 VITALS — BP 130/80 | HR 96 | Temp 98.3°F | Resp 18 | Ht 70.0 in | Wt 234.6 lb

## 2017-01-19 DIAGNOSIS — R809 Proteinuria, unspecified: Secondary | ICD-10-CM | POA: Diagnosis not present

## 2017-01-19 DIAGNOSIS — E785 Hyperlipidemia, unspecified: Secondary | ICD-10-CM | POA: Diagnosis not present

## 2017-01-19 DIAGNOSIS — E1129 Type 2 diabetes mellitus with other diabetic kidney complication: Secondary | ICD-10-CM | POA: Diagnosis not present

## 2017-01-19 LAB — POCT GLYCOSYLATED HEMOGLOBIN (HGB A1C): HEMOGLOBIN A1C: 4.8

## 2017-01-19 LAB — GLUCOSE, POCT (MANUAL RESULT ENTRY): POC Glucose: 96 mg/dl (ref 70–99)

## 2017-01-19 MED ORDER — LISINOPRIL 2.5 MG PO TABS: 2.5000 mg | ORAL_TABLET | Freq: Every day | ORAL | 0 refills | Status: DC

## 2017-01-19 MED ORDER — ROSUVASTATIN CALCIUM 5 MG PO TABS: 5.0000 mg | ORAL_TABLET | Freq: Every day | ORAL | 2 refills | Status: DC

## 2017-01-19 NOTE — Progress Notes (Signed)
Name: Bill Perez   MRN: [id number]    DOB: [birth year]   Date:01/19/2017       Progress Note  Subjective  Chief Complaint  Chief Complaint  Patient presents with  . Diabetes    follow up, medication refills  . Hypertension    Diabetes  He presents for his follow-up diabetic visit. He has type 2 diabetes mellitus. His disease course has been improving. There are no hypoglycemic associated symptoms. Pertinent negatives for hypoglycemia include no dizziness, hunger or sweats. Pertinent negatives for diabetes include no fatigue, no foot paresthesias, no polydipsia and no polyuria. Pertinent negatives for diabetic complications include no CVA, heart disease or peripheral neuropathy. Current diabetic treatment includes oral agent (monotherapy) (has not taken metformin in over 5 dyas, A1c is 4.8%). He is following a generally healthy diet. He rarely (mows the lawn pushing lawnmower.) participates in exercise. He monitors blood glucose at home 1-2 x per day. His breakfast blood glucose range is generally 90-110 mg/dl. An ACE inhibitor/angiotensin II receptor blocker is being taken.  Hyperlipidemia  This is a recurrent problem. The problem is controlled. Recent lipid tests were reviewed and are normal. Pertinent negatives include no leg pain, myalgias or shortness of breath. Current antihyperlipidemic treatment includes statins.      Past Medical History:  Diagnosis Date  . Hyperlipidemia     No past surgical history on file.  Family History  Problem Relation Age of Onset  . Hypertension Mother   . Hyperlipidemia Mother   . Stroke Father   . Diabetes Father     Social History   Social History  . Marital status: Single    Spouse name: N/A  . Number of children: N/A  . Years of education: N/A   Occupational History  . Not on file.   Social History Main Topics  . Smoking status: Current Every Day Smoker    Packs/day: 0.50    Years: 25.00    Types: Cigarettes  . Smokeless  tobacco: Never Used  . Alcohol use 2.4 oz/week    4 Glasses of wine per week  . Drug use: Yes  . Sexual activity: Not on file   Other Topics Concern  . Not on file   Social History Narrative  . No narrative on file     Current Outpatient Prescriptions:  .  aspirin 81 MG tablet, Take 81 mg by mouth daily., Disp: , Rfl:  .  Blood Glucose Monitoring Suppl (ONE TOUCH ULTRA MINI) w/Device KIT, See admin instructions., Disp: , Rfl: 0 .  Icosapent Ethyl (VASCEPA) 1 g CAPS, Take 2 capsules by mouth 2 (two) times daily after a meal., Disp: 120 capsule, Rfl: 2 .  lisinopril (PRINIVIL,ZESTRIL) 2.5 MG tablet, take 1 tablet by mouth once daily, Disp: 90 tablet, Rfl: 0 .  ONE TOUCH ULTRA TEST test strip, , Disp: , Rfl: 0 .  ONETOUCH DELICA LANCETS 70Y MISC, , Disp: , Rfl: 0 .  rosuvastatin (CRESTOR) 5 MG tablet, Take 1 tablet (5 mg total) by mouth daily., Disp: 30 tablet, Rfl: 2 .  metFORMIN (GLUCOPHAGE) 500 MG tablet, Take 1 tablet (500 mg total) by mouth 2 (two) times daily with a meal., Disp: 180 tablet, Rfl: 0  No Known Allergies   Review of Systems  Constitutional: Negative for fatigue.  Respiratory: Negative for shortness of breath.   Musculoskeletal: Negative for myalgias.  Neurological: Negative for dizziness.  Endo/Heme/Allergies: Negative for polydipsia.      Objective  Vitals:  01/19/17 0944  BP: 130/80  Pulse: 96  Resp: 18  Temp: 98.3 F (36.8 C)  TempSrc: Oral  SpO2: 97%  Weight: 234 lb 9.6 oz (106.4 kg)  Height: _0  (1.778 m)    Physical Exam  Constitutional: He is oriented to person, place, and time and well-developed, well-nourished, and in no distress.  HENT:  Head: Normocephalic and atraumatic.  Cardiovascular: Normal rate, regular rhythm and normal heart sounds.   No murmur heard. Pulmonary/Chest: Effort normal and breath sounds normal. He has no wheezes.  Abdominal: Soft. Bowel sounds are normal. There is no tenderness.  Musculoskeletal: He  exhibits no edema.  Neurological: He is alert and oriented to person, place, and time.  Psychiatric: Mood, memory, affect and judgment normal.  Nursing note and vitals reviewed.   Recent Results (from the past 2160 hour(s))  POCT Glucose (CBG)     Status: None   Collection Time: 01/19/17  9:47 AM  Result Value Ref Range   POC Glucose 96 70 - 99 mg/dl  POCT HgB A1C     Status: None   Collection Time: 01/19/17  9:51 AM  Result Value Ref Range   Hemoglobin A1C 4.8      Assessment & Plan  1. Type 2 diabetes mellitus with microalbuminuria, without long-term current use of insulin (HCC) Point of care A1c is 4.8%, well-controlled diabetes, DC metformin and continue dietary lifestyle changes - POCT HgB A1C - POCT Glucose (CBG) - lisinopril (PRINIVIL,ZESTRIL) 2.5 MG tablet; Take 1 tablet (2.5 mg total) by mouth daily.  Dispense: 90 tablet; Refill: 0  2. Dyslipidemia  - rosuvastatin (CRESTOR) 5 MG tablet; Take 1 tablet (5 mg total) by mouth daily.  Dispense: 30 tablet; Refill: 2   Elzina Devera Asad A. [facility] 01/19/2017 10:06 AM

## 2017-02-15 ENCOUNTER — Ambulatory Visit: Payer: BC Managed Care – PPO | Admitting: Family Medicine

## 2017-02-15 ENCOUNTER — Encounter: Payer: Self-pay | Admitting: Family Medicine

## 2017-02-15 VITALS — BP 124/86 | HR 99 | Temp 98.3°F | Resp 18 | Ht 70.0 in | Wt 239.9 lb

## 2017-02-15 DIAGNOSIS — J069 Acute upper respiratory infection, unspecified: Secondary | ICD-10-CM

## 2017-02-15 DIAGNOSIS — R05 Cough: Secondary | ICD-10-CM

## 2017-02-15 DIAGNOSIS — R059 Cough, unspecified: Secondary | ICD-10-CM

## 2017-02-15 DIAGNOSIS — R0981 Nasal congestion: Secondary | ICD-10-CM

## 2017-02-15 MED ORDER — FLUTICASONE PROPIONATE 50 MCG/ACT NA SUSP
2.0000 | Freq: Every day | NASAL | 0 refills | Status: DC
Start: 2017-02-15 — End: 2017-02-28

## 2017-02-15 MED ORDER — LEVOCETIRIZINE DIHYDROCHLORIDE 5 MG PO TABS: 5.0000 mg | ORAL_TABLET | Freq: Every evening | ORAL | 0 refills | Status: DC

## 2017-02-15 MED ORDER — BENZONATATE 100 MG PO CAPS: 100.0000 mg | ORAL_CAPSULE | Freq: Three times a day (TID) | ORAL | 0 refills | Status: DC | PRN

## 2017-02-15 NOTE — Progress Notes (Signed)
Name: Bill Perez   MRN: [id number]    DOB: 10-15-1970   Date:02/15/2017       Progress Note  Subjective  Chief Complaint  Chief Complaint  Patient presents with  . Sinusitis    cough, congested, wheezing, can't sleep, sneezing for 4 days    HPI  Patient presents with 4 days of sneezing, cough, sinus congestion; had one episode of mild wheezing last night while resting  Intermittent dry cough.  No shortness of breath or chest pain, abdominal pain, NVD, body aches, fevers/chills. - Recent sick contacts: Has been visiting her mom in medical rehab lately. - Current Smoker - has cut back form 1/2 pack to 7-8 cigarettes a day.    Patient Active Problem List   Diagnosis Date Noted  . Dyslipidemia 06/27/2016  . Abdominal wall strain 03/15/2016  . DM (diabetes mellitus), type 2, uncontrolled, with renal complications ([place]) 64/33/2951    Social History   Tobacco Use  . Smoking status: Current Every Day Smoker    Packs/day: 0.50    Years: 25.00    Pack years: 12.50    Types: Cigarettes  . Smokeless tobacco: Never Used  Substance Use Topics  . Alcohol use: Yes    Alcohol/week: 2.4 oz    Types: 4 Glasses of wine per week     Current Outpatient Medications:  .  aspirin 81 MG tablet, Take 81 mg by mouth daily., Disp: , Rfl:  .  Blood Glucose Monitoring Suppl (ONE TOUCH ULTRA MINI) w/Device KIT, See admin instructions., Disp: , Rfl: 0 .  lisinopril (PRINIVIL,ZESTRIL) 2.5 MG tablet, Take 1 tablet (2.5 mg total) by mouth daily., Disp: 90 tablet, Rfl: 0 .  ONE TOUCH ULTRA TEST test strip, , Disp: , Rfl: 0 .  ONETOUCH DELICA LANCETS 88C MISC, , Disp: , Rfl: 0 .  rosuvastatin (CRESTOR) 5 MG tablet, Take 1 tablet (5 mg total) by mouth daily., Disp: 30 tablet, Rfl: 2  No Known Allergies  ROS  Ten systems reviewed and is negative except as mentioned in HPI  Objective  Vitals:   02/15/17 0901  BP: 124/86  Pulse: 99  Resp: 18  Temp: 98.3 F (36.8 C)  TempSrc: Oral  SpO2:  96%  Weight: 239 lb 14.4 oz (108.8 kg)  Height: '5\' 10"'$  (1.778 m)   Body mass index is 34.42 kg/m.  Nursing Note and Vital Signs reviewed.  Physical Exam  Constitutional: Patient appears well-developed and well-nourished. Obese No distress.  HEENT: head atraumatic, normocephalic, pupils equal and reactive to light, EOM's intact, TM's without erythema or bulging, mild maxillary and frontal sinus tenderness, neck supple without lymphadenopathy, oropharynx pink and moist without exudate. Bilateral Turbinates inflamed. Cardiovascular: Normal rate, regular rhythm, S1/S2 present.  No murmur or rub heard. No BLE edema. Pulmonary/Chest: Effort normal and breath sounds clear. No respiratory distress or retractions. Psychiatric: Patient has a normal mood and affect. behavior is normal. Judgment and thought content normal.  Recent Results (from the past 2160 hour(s))  POCT Glucose (CBG)     Status: None   Collection Time: 01/19/17  9:47 AM  Result Value Ref Range   POC Glucose 96 70 - 99 mg/dl  POCT HgB A1C     Status: None   Collection Time: 01/19/17  9:51 AM  Result Value Ref Range   Hemoglobin A1C 4.8      Assessment & Plan  1. Viral upper respiratory illness - benzonatate (TESSALON PERLES) 100 MG capsule; Take 1 capsule (100  mg total) 3 (three) times daily as needed by mouth.  Dispense: 20 capsule; Refill: 0 - fluticasone (FLONASE) 50 MCG/ACT nasal spray; Place 2 sprays daily into both nostrils.  Dispense: 16 g; Refill: 0 - levocetirizine (XYZAL) 5 MG tablet; Take 1 tablet (5 mg total) every evening by mouth.  Dispense: 30 tablet; Refill: 0  2. Cough - benzonatate (TESSALON PERLES) 100 MG capsule; Take 1 capsule (100 mg total) 3 (three) times daily as needed by mouth.  Dispense: 20 capsule; Refill: 0  3. Nasal congestion - fluticasone (FLONASE) 50 MCG/ACT nasal spray; Place 2 sprays daily into both nostrils.  Dispense: 16 g; Refill: 0 - levocetirizine (XYZAL) 5 MG tablet; Take 1  tablet (5 mg total) every evening by mouth.  Dispense: 30 tablet; Refill: 0  - Work note provided for today and tomorrow. - Discussed typical course of viral URI and to monitor symptoms for significant worsening, fevers, shortness of breath, or chest pain. - Return if symptoms worsen or fail to improve. -Red flags and when to present for emergency care or RTC including fever >101.29F, chest pain, shortness of breath, new/worsening/un-resolving symptoms, reviewed with patient at time of visit. Follow up and care instructions discussed and provided in AVS.

## 2017-02-15 NOTE — Patient Instructions (Addendum)
Upper Respiratory Infection, Adult Most upper respiratory infections (URIs) are caused by a virus. A URI affects the nose, throat, and upper air passages. The most common type of URI is often called "the common cold." Follow these instructions at home:  Take medicines only as told by your doctor.  Gargle warm saltwater or take cough drops to comfort your throat as told by your doctor.  Use a warm mist humidifier or inhale steam from a shower to increase air moisture. This may make it easier to breathe.  Drink enough fluid to keep your pee (urine) clear or pale yellow.  Eat soups and other clear broths.  Have a healthy diet.  Rest as needed.  Go back to work when your fever is gone or your doctor says it is okay. ? You may need to stay home longer to avoid giving your URI to others. ? You can also wear a face mask and wash your hands often to prevent spread of the virus.  Use your inhaler more if you have asthma.  Do not use any tobacco products, including cigarettes, chewing tobacco, or electronic cigarettes. If you need help quitting, ask your doctor. Contact a doctor if:  You are getting worse, not better.  Your symptoms are not helped by medicine.  You have chills.  You are getting more short of breath.  You have brown or red mucus.  You have yellow or brown discharge from your nose.  You have pain in your face, especially when you bend forward.  You have a fever.  You have puffy (swollen) neck glands.  You have pain while swallowing.  You have white areas in the back of your throat. Get help right away if:  You have very bad or constant: ? Headache. ? Ear pain. ? Pain in your forehead, behind your eyes, and over your cheekbones (sinus pain). ? Chest pain.  You have long-lasting (chronic) lung disease and any of the following: ? Wheezing. ? Long-lasting cough. ? Coughing up blood. ? A change in your usual mucus.  You have a stiff neck.  You have  changes in your: ? Vision. ? Hearing. ? Thinking. ? Mood. This information is not intended to replace advice given to you by your health care provider. Make sure you discuss any questions you have with your health care provider. Document Released: 09/13/2007 Document Revised: 11/28/2015 Document Reviewed: 07/02/2013 Elsevier Interactive Patient Education  2018 [employer] Inc[employer]  

## 2017-02-28 ENCOUNTER — Ambulatory Visit (INDEPENDENT_AMBULATORY_CARE_PROVIDER_SITE_OTHER): Payer: BC Managed Care – PPO | Admitting: Family Medicine

## 2017-02-28 ENCOUNTER — Encounter: Payer: Self-pay | Admitting: Family Medicine

## 2017-02-28 ENCOUNTER — Encounter: Payer: BC Managed Care – PPO | Admitting: Family Medicine

## 2017-02-28 VITALS — BP 124/80 | HR 92 | Temp 98.4°F | Resp 16 | Ht 70.0 in | Wt 239.3 lb

## 2017-02-28 DIAGNOSIS — E1165 Type 2 diabetes mellitus with hyperglycemia: Secondary | ICD-10-CM | POA: Diagnosis not present

## 2017-02-28 DIAGNOSIS — M79652 Pain in left thigh: Secondary | ICD-10-CM | POA: Diagnosis not present

## 2017-02-28 DIAGNOSIS — E785 Hyperlipidemia, unspecified: Secondary | ICD-10-CM

## 2017-02-28 DIAGNOSIS — E1129 Type 2 diabetes mellitus with other diabetic kidney complication: Secondary | ICD-10-CM | POA: Diagnosis not present

## 2017-02-28 DIAGNOSIS — R2689 Other abnormalities of gait and mobility: Secondary | ICD-10-CM

## 2017-02-28 DIAGNOSIS — Z114 Encounter for screening for human immunodeficiency virus [HIV]: Secondary | ICD-10-CM

## 2017-02-28 DIAGNOSIS — Z0000 Encounter for general adult medical examination without abnormal findings: Secondary | ICD-10-CM | POA: Diagnosis not present

## 2017-02-28 DIAGNOSIS — Z23 Encounter for immunization: Secondary | ICD-10-CM | POA: Diagnosis not present

## 2017-02-28 DIAGNOSIS — IMO0002 Reserved for concepts with insufficient information to code with codable children: Secondary | ICD-10-CM

## 2017-02-28 NOTE — Progress Notes (Signed)
Name: Bill Perez   MRN: [id number]    DOB: 04-Apr-1971   Date:02/28/2017       Progress Note  Subjective  Chief Complaint  Chief Complaint  Patient presents with  . Annual Exam    HPI  Patient presents for annual CPE and LEFT quadricept pain.  LEFT Quadricept pain: Started a few months ago after moving a couch and it hit the LEFT anterior thigh. Notes ongoing pain and a slight limp while walking. He thinks it has become more noticeable and people are starting to ask him about it. He denies weakness/numbness/tingling, but says he can't really run anymore. Declines NSAID treatment today.  USPSTF grade A and B recommendations:  Diet: Breakfast - 3 link sausages, water; lunch - rice, grapes, apples, sometimes has cafeteria food, green beans, etc.; dinner - trying not to fry and starting to bake a lot more - chicken, fish, corn, green beans, collard greens. Exercise: Walks 37mle every other day - takes him about 243ms - taking longer because of his leg pain as above.  IPSS Questionnaire (AUA-7): Over the past month.   1)  How often have you had a sensation of not emptying your bladder completely after you finish urinating?  0 - Not at all  2)  How often have you had to urinate again less than two hours after you finished urinating? 0 - Not at all  3)  How often have you found you stopped and started again several times when you urinated?  0 - Not at all  4) How difficult have you found it to postpone urination?  0 - Not at all  5) How often have you had a weak urinary stream?  0 - Not at all  6) How often have you had to push or strain to begin urination?  0 - Not at all  7) How many times did you most typically get up to urinate from the time you went to bed until the time you got up in the morning?  0 - None  Total score:  0-7 mildly symptomatic   8-19 moderately symptomatic   20-35 severely symptomatic  No family or personal history of prostate cancer. Score is 0 today. We will  hold off on PSA testing or digital rectal exam after collaborative discussion with patient.  Depression: No concerns today [facility] 09/13/2016 03/03/2015 10/15/2014 10/06/2014  Decreased Interest 0 0 0 0  Down, Depressed, Hopeless 0 0 0 0  PHQ - 2 Score 0 0 0 0   Hypertension: BP Readings from Last 3 Encounters:  02/28/17 124/80  02/15/17 124/86  01/19/17 130/80   Obesity: Wt Readings from Last 3 Encounters:  02/28/17 239 lb 4.8 oz (108.5 kg)  02/15/17 239 lb 14.4 oz (108.8 kg)  01/19/17 234 lb 9.6 oz (106.4 kg)   BMI Readings from Last 3 Encounters:  02/28/17 34.34 kg/m  02/15/17 34.42 kg/m  01/19/17 33.66 kg/m    Lipids:  Lab Results  Component Value Date   CHOL 104 09/13/2016   CHOL 182 05/30/2016   Lab Results  Component Value Date   HDL 26 (L) 09/13/2016   HDL 23 (L) 05/30/2016   Lab Results  Component Value Date   LDLCALC 56 09/13/2016   LDLCALC NOT CALC 05/30/2016   Lab Results  Component Value Date   TRIG 109 09/13/2016   TRIG 537 (H) 05/30/2016   Lab Results  Component Value Date   CHOLHDL 4.0 09/13/2016  CHOLHDL 7.9 (H) 05/30/2016   No results found for: LDLDIRECT Glucose:  Glucose, Bld  Date Value Ref Range Status  05/30/2016 304 (H) 65 - 99 mg/dL Final    Alcohol: Drinks about 2 days a week (weekends) - 3 drinks in one sitting Tobacco use: Down to 7 cigarettes a day - trying to decrease a little more - no quit date in mind.  Married - Wife; no addition sexual partners in the last year STD testing and prevention (chl/gon/syphilis): Declines. HIV: We will do today for routine screen.  Skin cancer: No concerning lesions or moles that he has noticed. Colorectal cancer: No family history; denies blood in stool, dark and tarry stool, pale stools, constipation or diarrhea. Prostate cancer: No family history, no personal history of any issues; AUA score 0 today. No results found for: PSA  Aspirin: taking 48m daily ECG:  Patient declines today.  Patient Active Problem List   Diagnosis Date Noted  . Dyslipidemia 06/27/2016  . Abdominal wall strain 03/15/2016  . DM (diabetes mellitus), type 2, uncontrolled, with renal complications ([place] 044/[id number]   No past surgical history on file.  Family History  Problem Relation Age of Onset  . Hypertension Mother   . Hyperlipidemia Mother   . Stroke Father   . Diabetes Father     Social History   Socioeconomic History  . Marital status: Single    Spouse name: Not on file  . Number of children: Not on file  . Years of education: Not on file  . Highest education level: Not on file  Social Needs  . Financial resource strain: Not on file  . Food insecurity - worry: Not on file  . Food insecurity - inability: Not on file  . Transportation needs - medical: Not on file  . Transportation needs - non-medical: Not on file  Occupational History  . Not on file  Tobacco Use  . Smoking status: Current Every Day Smoker    Packs/day: 0.50    Years: 25.00    Pack years: 12.50    Types: Cigarettes  . Smokeless tobacco: Never Used  Substance and Sexual Activity  . Alcohol use: Yes    Alcohol/week: 2.4 oz    Types: 4 Glasses of wine per week  . Drug use: Yes  . Sexual activity: Not on file  Other Topics Concern  . Not on file  Social History Narrative  . Not on file     Current Outpatient Medications:  .  aspirin 81 MG tablet, Take 81 mg by mouth daily., Disp: , Rfl:  .  Blood Glucose Monitoring Suppl (ONE TOUCH ULTRA MINI) w/Device KIT, See admin instructions., Disp: , Rfl: 0 .  lisinopril (PRINIVIL,ZESTRIL) 2.5 MG tablet, Take 1 tablet (2.5 mg total) by mouth daily., Disp: 90 tablet, Rfl: 0 .  ONE TOUCH ULTRA TEST test strip, , Disp: , Rfl: 0 .  ONETOUCH DELICA LANCETS 317RMISC, , Disp: , Rfl: 0 .  rosuvastatin (CRESTOR) 5 MG tablet, Take 1 tablet (5 mg total) by mouth daily., Disp: 30 tablet, Rfl: 2 .  benzonatate (TESSALON PERLES) 100 MG capsule,  Take 1 capsule (100 mg total) 3 (three) times daily as needed by mouth. (Patient not taking: Reported on 02/28/2017), Disp: 20 capsule, Rfl: 0 .  fluticasone (FLONASE) 50 MCG/ACT nasal spray, Place 2 sprays daily into both nostrils. (Patient not taking: Reported on 02/28/2017), Disp: 16 g, Rfl: 0 .  levocetirizine (XYZAL) 5 MG tablet, Take 1 tablet (  5 mg total) every evening by mouth. (Patient not taking: Reported on 02/28/2017), Disp: 30 tablet, Rfl: 0  No Known Allergies   ROS  Constitutional: Negative for fever or weight change.  Respiratory: Negative for cough and shortness of breath.   Cardiovascular: Negative for chest pain or palpitations.  Gastrointestinal: Negative for abdominal pain, no bowel changes.  Musculoskeletal: See HPI Skin: Negative for rash.  Neurological: Negative for dizziness or headache.  No other specific complaints in a complete review of systems (except as listed in HPI above).  Objective  Vitals:   02/28/17 0903  BP: 124/80  Pulse: 92  Resp: 16  Temp: 98.4 F (36.9 C)  TempSrc: Oral  SpO2: 96%  Weight: 239 lb 4.8 oz (108.5 kg)  Height: 5' 10"  (1.778 m)    Body mass index is 34.34 kg/m.  Physical Exam Constitutional: Patient appears well-developed and well-nourished. No distress.  HENT: Head: Normocephalic and atraumatic. Ears: B TMs ok, no erythema or effusion; Nose: Nose normal. Mouth/Throat: Oropharynx is clear and moist. No oropharyngeal exudate.  Eyes: Conjunctivae and EOM are normal. Pupils are equal, round, and reactive to light. No scleral icterus.  Neck: Normal range of motion. Neck supple. No JVD present. No thyromegaly present.  Cardiovascular: Normal rate, regular rhythm and normal heart sounds.  No murmur heard. No BLE edema. Pulmonary/Chest: Effort normal and breath sounds normal. No respiratory distress. Abdominal: Soft. Bowel sounds are normal, no distension. There is no tenderness. no masses MALE GENITALIA: Deferred per  patient RECTAL: Deferred per patient Musculoskeletal: Normal range of motion, no joint effusions. No gross deformities. BLE have adequate strength; LLE shows no laxity, Left knee has mild crepitus. Neurological: he is alert and oriented to person, place, and time. No cranial nerve deficit. Coordination, balance, strength, speech and gait are normal.  Skin: Skin is warm and dry. No rash noted. No erythema.  Psychiatric: Patient has a normal mood and affect. behavior is normal. Judgment and thought content normal.  Recent Results (from the past 2160 hour(s))  POCT Glucose (CBG)     Status: None   Collection Time: 01/19/17  9:47 AM  Result Value Ref Range   POC Glucose 96 70 - 99 mg/dl  POCT HgB A1C     Status: None   Collection Time: 01/19/17  9:51 AM  Result Value Ref Range   Hemoglobin A1C 4.8    PHQ2/9: Depression screen [facility] 2/9 09/13/2016 03/03/2015 10/15/2014 10/06/2014  Decreased Interest 0 0 0 0  Down, Depressed, Hopeless 0 0 0 0  PHQ - 2 Score 0 0 0 0   Fall Risk: Fall Risk  09/13/2016 03/03/2015 10/15/2014 10/06/2014  Falls in the past year? No No No No   Assessment & Plan  1. Encounter for annual physical exam - COMPLETE METABOLIC PANEL WITH GFR - Lipid panel -Prostate cancer screening and PSA options (with potential risks and benefits of testing vs not testing) were discussed along with recent recs/guidelines. -USPSTF grade A and B recommendations reviewed with patient; age-appropriate recommendations, preventive care, screening tests, etc discussed and encouraged; healthy living encouraged; see AVS for patient education given to patient -Discussed importance of 150 minutes of physical activity weekly, eat two servings of fish weekly, eat one serving of tree nuts ( cashews, pistachios, pecans, almonds.Marland Kitchen) every other day, eat 6 servings of fruit/vegetables daily and drink plenty of water and avoid sweet beverages.   2. Left thigh pain - Ambulatory referral to Orthopedic Surgery 3.  Antalgic gait - Ambulatory  referral to Orthopedic Surgery -Red flags and when to present for emergency care or RTC including fever >101.64F, chest pain, shortness of breath, new/worsening/un-resolving symptoms, extremity weakness or giving out, numbness/tingling, reviewed with patient at time of visit. Follow up and care instructions discussed and provided in AVS.  4. Needs flu shot Patient declines flu shot  5. Need for Tdap vaccination - Tdap vaccine greater than or equal to 7yo IM  6. Encounter for screening for HIV - HIV antibody - Routine screen  7. DM (diabetes mellitus), type 2, uncontrolled, with renal complications (HCC) - COMPLETE METABOLIC PANEL WITH GFR  8. Dyslipidemia - Lipid panel  - Patient will return on 03/15/2017 or after to have fasting labs drawn.

## 2017-02-28 NOTE — Patient Instructions (Addendum)
Please call your insurance company and ask if you are covered to receive your pneumonia vaccine early because you have diabetes. If so, please ask if you can have this done at your pharmacy or in our clinic.  Please return on or after 03/15/2017 for fasting labwork to be performed    Preventive Care 40-64 Years, Male Preventive care refers to lifestyle choices and visits with your health care provider that can promote health and wellness. What does preventive care include?  A yearly physical exam. This is also called an annual well check.  Dental exams once or twice a year.  Routine eye exams. Ask your health care provider how often you should have your eyes checked.  Personal lifestyle choices, including: ? Daily care of your teeth and gums. ? Regular physical activity. ? Eating a healthy diet. ? Avoiding tobacco and drug use. ? Limiting alcohol use. ? Practicing safe sex. ? Taking low-dose aspirin every day starting at age 54. What happens during an annual well check? The services and screenings done by your health care provider during your annual well check will depend on your age, overall health, lifestyle risk factors, and family history of disease. Counseling Your health care provider may ask you questions about your:  Alcohol use.  Tobacco use.  Drug use.  Emotional well-being.  Home and relationship well-being.  Sexual activity.  Eating habits.  Work and work Statistician.  Screening You may have the following tests or measurements:  Height, weight, and BMI.  Blood pressure.  Lipid and cholesterol levels. These may be checked every 5 years, or more frequently if you are over 20 years old.  Skin check.  Lung cancer screening. You may have this screening every year starting at age 80 if you have a 30-pack-year history of smoking and currently smoke or have quit within the past 15 years.  Fecal occult blood test (FOBT) of the stool. You may have this test  every year starting at age 84.  Flexible sigmoidoscopy or colonoscopy. You may have a sigmoidoscopy every 5 years or a colonoscopy every 10 years starting at age 70.  Prostate cancer screening. Recommendations will vary depending on your family history and other risks.  Hepatitis C blood test.  Hepatitis B blood test.  Sexually transmitted disease (STD) testing.  Diabetes screening. This is done by checking your blood sugar (glucose) after you have not eaten for a while (fasting). You may have this done every 1-3 years.  Discuss your test results, treatment options, and if necessary, the need for more tests with your health care provider. Vaccines Your health care provider may recommend certain vaccines, such as:  Influenza vaccine. This is recommended every year.  Tetanus, diphtheria, and acellular pertussis (Tdap, Td) vaccine. You may need a Td booster every 10 years.  Varicella vaccine. You may need this if you have not been vaccinated.  Zoster vaccine. You may need this after age 58.  Measles, mumps, and rubella (MMR) vaccine. You may need at least one dose of MMR if you were born in 1957 or later. You may also need a second dose.  Pneumococcal 13-valent conjugate (PCV13) vaccine. You may need this if you have certain conditions and have not been vaccinated.  Pneumococcal polysaccharide (PPSV23) vaccine. You may need one or two doses if you smoke cigarettes or if you have certain conditions.  Meningococcal vaccine. You may need this if you have certain conditions.  Hepatitis A vaccine. You may need this if you have  certain conditions or if you travel or work in places where you may be exposed to hepatitis A.  Hepatitis B vaccine. You may need this if you have certain conditions or if you travel or work in places where you may be exposed to hepatitis B.  Haemophilus influenzae type b (Hib) vaccine. You may need this if you have certain risk factors.  Talk to your health  care provider about which screenings and vaccines you need and how often you need them. This information is not intended to replace advice given to you by your health care provider. Make sure you discuss any questions you have with your health care provider. Document Released: 04/23/2015 Document Revised: 12/15/2015 Document Reviewed: 01/26/2015 Elsevier Interactive Patient Education  2017 [employer].

## 2017-03-14 DIAGNOSIS — S76919A Strain of unspecified muscles, fascia and tendons at thigh level, unspecified thigh, initial encounter: Secondary | ICD-10-CM | POA: Insufficient documentation

## 2017-03-26 ENCOUNTER — Telehealth: Payer: Self-pay | Admitting: Family Medicine

## 2017-03-26 NOTE — Telephone Encounter (Signed)
Please call and remind pt to come in for fasting labs. Thanks!

## 2017-03-27 NOTE — Telephone Encounter (Signed)
Pt.notified

## 2017-04-07 LAB — COMPLETE METABOLIC PANEL WITH GFR
AG RATIO: 1.4 (calc) (ref 1.0–2.5)
ALT: 22 U/L (ref 9–46)
AST: 19 U/L (ref 10–40)
Albumin: 4 g/dL (ref 3.6–5.1)
Alkaline phosphatase (APISO): 89 U/L (ref 40–115)
BUN: 15 mg/dL (ref 7–25)
CALCIUM: 8.8 mg/dL (ref 8.6–10.3)
CO2: 28 mmol/L (ref 20–32)
CREATININE: 1.23 mg/dL (ref 0.60–1.35)
Chloride: 104 mmol/L (ref 98–110)
GFR, EST AFRICAN AMERICAN: 81 mL/min/{1.73_m2} (ref >=60)
GFR, EST NON AFRICAN AMERICAN: 70 mL/min/{1.73_m2} (ref >=60)
GLOBULIN: 2.8 g/dL (ref 1.9–3.7)
Glucose, Bld: 94 mg/dL (ref 65–99)
Potassium: 4.5 mmol/L (ref 3.5–5.3)
SODIUM: 139 mmol/L (ref 135–146)
TOTAL PROTEIN: 6.8 g/dL (ref 6.1–8.1)
Total Bilirubin: 0.8 mg/dL (ref 0.2–1.2)

## 2017-04-07 LAB — HIV ANTIBODY (ROUTINE TESTING W REFLEX): HIV: NONREACTIVE

## 2017-04-07 LAB — LIPID PANEL
CHOL/HDL RATIO: 4.2 (calc) (ref <5.0)
Cholesterol: 114 mg/dL (ref <200)
HDL: 27 mg/dL — AB (ref >40)
LDL Cholesterol (Calc): 66 mg/dL (calc)
NON-HDL CHOLESTEROL (CALC): 87 mg/dL (ref <130)
TRIGLYCERIDES: 127 mg/dL (ref <150)

## 2017-04-09 ENCOUNTER — Telehealth: Payer: Self-pay | Admitting: Family Medicine

## 2017-04-09 NOTE — Telephone Encounter (Signed)
Pt. Given lab results and instructions.Verbalizes understanding. 

## 2017-10-29 ENCOUNTER — Encounter: Payer: Self-pay | Admitting: Family Medicine

## 2017-10-29 ENCOUNTER — Ambulatory Visit (INDEPENDENT_AMBULATORY_CARE_PROVIDER_SITE_OTHER): Payer: BC Managed Care – PPO | Admitting: Family Medicine

## 2017-10-29 VITALS — BP 130/80 | HR 82 | Temp 98.4°F | Resp 18 | Ht 70.0 in | Wt 249.2 lb

## 2017-10-29 DIAGNOSIS — Z716 Tobacco abuse counseling: Secondary | ICD-10-CM | POA: Diagnosis not present

## 2017-10-29 DIAGNOSIS — E785 Hyperlipidemia, unspecified: Secondary | ICD-10-CM | POA: Diagnosis not present

## 2017-10-29 DIAGNOSIS — R809 Proteinuria, unspecified: Secondary | ICD-10-CM

## 2017-10-29 DIAGNOSIS — M25512 Pain in left shoulder: Secondary | ICD-10-CM

## 2017-10-29 DIAGNOSIS — Z6835 Body mass index (BMI) 35.0-35.9, adult: Secondary | ICD-10-CM | POA: Diagnosis not present

## 2017-10-29 DIAGNOSIS — E1129 Type 2 diabetes mellitus with other diabetic kidney complication: Secondary | ICD-10-CM

## 2017-10-29 DIAGNOSIS — M542 Cervicalgia: Secondary | ICD-10-CM

## 2017-10-29 MED ORDER — LISINOPRIL 2.5 MG PO TABS: 2.5000 mg | ORAL_TABLET | Freq: Every day | ORAL | 0 refills | Status: DC

## 2017-10-29 MED ORDER — MELOXICAM 15 MG PO TABS: 15.0000 mg | ORAL_TABLET | Freq: Every day | ORAL | 0 refills | Status: DC

## 2017-10-29 MED ORDER — TIZANIDINE HCL 4 MG PO TABS: 4.0000 mg | ORAL_TABLET | Freq: Three times a day (TID) | ORAL | 0 refills | Status: DC | PRN

## 2017-10-29 MED ORDER — ROSUVASTATIN CALCIUM 5 MG PO TABS: 5.0000 mg | ORAL_TABLET | Freq: Every day | ORAL | 0 refills | Status: DC

## 2017-10-29 NOTE — Progress Notes (Signed)
Name: Bill Perez   MRN: [id number]    DOB: 1970-09-16   Date:10/29/2017       Progress Note  Subjective  Chief Complaint  Chief Complaint  Patient presents with  . Shoulder Pain    for 2 weeks    HPI  Hyperlipidemia: He has been out of his Crestor for about 4 months. HE denies chest pain, shortness of breath, BLE edema, or myalgias.  Last food was 8:30am, labs to be drawn today after 2:00pm  Diabetes: He has been diet controlled, has not had A1C since October 2018.  We will recheck today.  Denies polyuria, polydipsia, polyphagia. He was taking 2.53m Lisinopril for kidney protection but stopped on his own. We will re-order this today.  Taking 869mASA - recalculated need for aspirin, guidelines recommend continuing.  Diet has been so-so - not following a particularly strict diabetic diet; doesn't eat many sweets. Due for labs today and Urine Micro.  LEFT Shoulder Pain: started about 2 weeks ago, pain radiates from the left side of his neck/shoulder down to his left hand.  He has tried Naproxen with some relief.  Pain is exacerbated by certain movements and putting pressure on the area. He does use string trimmer and mow, but otherwise no recent repetitive motions, no recent injury to the area.  He used to pl[employer] Groupand [employer]but no old injuries.  Tobacco Use: Not ready to quit, still smoking 1/2 ppd; cessation counseling provided.  Obesity: He is not following any particular diet at this time; he is still walking frequently, but says he could improve on his nutrition.   Patient Active Problem List   Diagnosis Date Noted  . Dyslipidemia 06/27/2016  . Abdominal wall strain 03/15/2016  . DM (diabetes mellitus), type 2, uncontrolled, with renal complications ([id number]  No past surgical history on file.  Family History  Problem Relation Age of Onset  . Hypertension Mother   . Hyperlipidemia Mother   . Stroke Father   . Diabetes Father      Social History   Socioeconomic History  . Marital status: Single    Spouse name: Not on file  . Number of children: Not on file  . Years of education: Not on file  . Highest education level: Not on file  Occupational History  . Not on file  Social Needs  . Financial resource strain: Not on file  . Food insecurity:    Worry: Not on file    Inability: Not on file  . Transportation needs:    Medical: Not on file    Non-medical: Not on file  Tobacco Use  . Smoking status: Current Every Day Smoker    Packs/day: 0.50    Years: 25.00    Pack years: 12.50    Types: Cigarettes  . Smokeless tobacco: Never Used  Substance and Sexual Activity  . Alcohol use: Yes    Alcohol/week: 2.4 oz    Types: 4 Glasses of wine per week  . Drug use: Yes  . Sexual activity: Not on file  Lifestyle  . Physical activity:    Days per week: Not on file    Minutes per session: Not on file  . Stress: Not on file  Relationships  . Social connections:    Talks on phone: Not on file    Gets together: Not on file    Attends religious service: Not on file    Active member of club or  organization: Not on file    Attends meetings of clubs or organizations: Not on file    Relationship status: Not on file  . Intimate partner violence:    Fear of current or ex partner: Not on file    Emotionally abused: Not on file    Physically abused: Not on file    Forced sexual activity: Not on file  Other Topics Concern  . Not on file  Social History Narrative  . Not on file     Current Outpatient Medications:  .  aspirin 81 MG tablet, Take 81 mg by mouth daily., Disp: , Rfl:  .  Blood Glucose Monitoring Suppl (ONE TOUCH ULTRA MINI) w/Device KIT, See admin instructions., Disp: , Rfl: 0 .  lisinopril (PRINIVIL,ZESTRIL) 2.5 MG tablet, Take 1 tablet (2.5 mg total) by mouth daily., Disp: 90 tablet, Rfl: 0 .  ONE TOUCH ULTRA TEST test strip, , Disp: , Rfl: 0 .  ONETOUCH DELICA LANCETS 55D MISC, , Disp: , Rfl:  0 .  rosuvastatin (CRESTOR) 5 MG tablet, Take 1 tablet (5 mg total) by mouth daily., Disp: 30 tablet, Rfl: 2  No Known Allergies  ROS Ten systems reviewed and is negative except as mentioned in HPI  Objective  Vitals:   10/29/17 1333  BP: 130/80  Pulse: 82  Resp: 18  Temp: 98.4 F (36.9 C)  TempSrc: Oral  SpO2: 98%  Weight: 249 lb 3.2 oz (113 kg)  Height: 5' 10"  (1.778 m)   Body mass index is 35.76 kg/m.  Physical Exam  Constitutional: He is oriented to person, place, and time. He appears well-developed and well-nourished. No distress.  HENT:  Head: Normocephalic and atraumatic.  Nose: Nose normal.  Mouth/Throat: Oropharynx is clear and moist. No oropharyngeal exudate.  Eyes: Pupils are equal, round, and reactive to light. Conjunctivae and EOM are normal.  Neck: Normal range of motion. Neck supple. No JVD present.  Cardiovascular: Normal rate, regular rhythm and normal heart sounds.  Pulmonary/Chest: Effort normal. He has rales. He exhibits no tenderness.  Coarse breathe sounds at the bases; pt is current daily smoker.  Abdominal: Soft. Bowel sounds are normal.  Musculoskeletal: He exhibits no edema.       Right shoulder: He exhibits decreased range of motion, tenderness, pain and decreased strength. He exhibits no bony tenderness, no swelling, no crepitus, no deformity and no spasm.       Arms: Neurological: He is alert and oriented to person, place, and time. No cranial nerve deficit.  Skin: Skin is warm and dry. No rash noted.  Psychiatric: He has a normal mood and affect. His behavior is normal. Judgment and thought content normal.  Nursing note and vitals reviewed.  No results found for this or any previous visit (from the past 72 hour(s)).  PHQ2/9: Depression screen [facility] 2/9 10/29/2017 09/13/2016 03/03/2015 10/15/2014 10/06/2014  Decreased Interest 0 0 0 0 0  Down, Depressed, Hopeless 0 0 0 0 0  PHQ - 2 Score 0 0 0 0 0  Altered sleeping 0 - - - -  Tired, decreased  energy 0 - - - -  Change in appetite 0 - - - -  Feeling bad or failure about yourself  0 - - - -  Trouble concentrating 0 - - - -  Moving slowly or fidgety/restless 0 - - - -  Suicidal thoughts 0 - - - -  PHQ-9 Score 0 - - - -   Fall Risk: Fall Risk  10/29/2017  09/13/2016 03/03/2015 10/15/2014 10/06/2014  Falls in the past year? No No No No No    Assessment & Plan  1. Type 2 diabetes mellitus with microalbuminuria, without long-term current use of insulin (HCC) - lisinopril (PRINIVIL,ZESTRIL) 2.5 MG tablet; Take 1 tablet (2.5 mg total) by mouth daily.  Dispense: 90 tablet; Refill: 0 - Hemoglobin A1c - COMPLETE METABOLIC PANEL WITH GFR - Microalbumin / creatinine urine ratio - Currently diet controlled; labs as above.  2. Dyslipidemia - rosuvastatin (CRESTOR) 5 MG tablet; Take 1 tablet (5 mg total) by mouth daily.  Dispense: 90 tablet; Refill: 0 - Lipid panel  3. Neck pain on left side - tiZANidine (ZANAFLEX) 4 MG tablet; Take 1 tablet (4 mg total) by mouth every 8 (eight) hours as needed for muscle spasms.  Dispense: 20 tablet; Refill: 0 - meloxicam (MOBIC) 15 MG tablet; Take 1 tablet (15 mg total) by mouth daily.  Dispense: 30 tablet; Refill: 0 4. Acute pain of left shoulder - tiZANidine (ZANAFLEX) 4 MG tablet; Take 1 tablet (4 mg total) by mouth every 8 (eight) hours as needed for muscle spasms.  Dispense: 20 tablet; Refill: 0 - meloxicam (MOBIC) 15 MG tablet; Take 1 tablet (15 mg total) by mouth daily.  Dispense: 30 tablet; Refill: 0  - Advised that if pain and ROM are not improving in about 1 week, we will send to ortho for further evaluation.  5. Tobacco abuse counseling Cessation discussed, pt not ready at this time.  6. Class 2 severe obesity due to excess calories with serious comorbidity and body mass index (BMI) of 35.0 to 35.9 in adult [facility]) - Discussed importance of 150 minutes of physical activity weekly, eat two servings of fish weekly, eat one serving of tree nuts (  cashews, pistachios, pecans, almonds.Marland Kitchen) every other day, eat 6 servings of fruit/vegetables daily and drink plenty of water and avoid sweet beverages.

## 2017-10-29 NOTE — Patient Instructions (Addendum)
Please have a repeat of your Eye Exam this year.    Diabetes Mellitus and Nutrition When you have diabetes (diabetes mellitus), it is very important to have healthy eating habits because your blood sugar (glucose) levels are greatly affected by what you eat and drink. Eating healthy foods in the appropriate amounts, at about the same times every day, can help you:  Control your blood glucose.  Lower your risk of heart disease.  Improve your blood pressure.  Reach or maintain a healthy weight.  Every person with diabetes is different, and each person has different needs for a meal plan. Your health care provider may recommend that you work with a diet and nutrition specialist (dietitian) to make a meal plan that is best for you. Your meal plan may vary depending on factors such as:  The calories you need.  The medicines you take.  Your weight.  Your blood glucose, blood pressure, and cholesterol levels.  Your activity level.  Other health conditions you have, such as heart or kidney disease.  How do carbohydrates affect me? Carbohydrates affect your blood glucose level more than any other type of food. Eating carbohydrates naturally increases the amount of glucose in your blood. Carbohydrate counting is a method for keeping track of how many carbohydrates you eat. Counting carbohydrates is important to keep your blood glucose at a healthy level, especially if you use insulin or take certain oral diabetes medicines. It is important to know how many carbohydrates you can safely have in each meal. This is different for every person. Your dietitian can help you calculate how many carbohydrates you should have at each meal and for snack. Foods that contain carbohydrates include:  Bread, cereal, rice, pasta, and crackers.  Potatoes and corn.  Peas, beans, and lentils.  Milk and yogurt.  Fruit and juice.  Desserts, such as cakes, cookies, ice cream, and candy.  How does alcohol  affect me? Alcohol can cause a sudden decrease in blood glucose (hypoglycemia), especially if you use insulin or take certain oral diabetes medicines. Hypoglycemia can be a life-threatening condition. Symptoms of hypoglycemia (sleepiness, dizziness, and confusion) are similar to symptoms of having too much alcohol. If your health care provider says that alcohol is safe for you, follow these guidelines:  Limit alcohol intake to no more than 1 drink per day for nonpregnant women and 2 drinks per day for men. One drink equals 12 oz of beer, 5 oz of wine, or 1 oz of hard liquor.  Do not drink on an empty stomach.  Keep yourself hydrated with water, diet soda, or unsweetened iced tea.  Keep in mind that regular soda, juice, and other mixers may contain a lot of sugar and must be counted as carbohydrates.  What are tips for following this plan? Reading food labels  Start by checking the serving size on the label. The amount of calories, carbohydrates, fats, and other nutrients listed on the label are based on one serving of the food. Many foods contain more than one serving per package.  Check the total grams (g) of carbohydrates in one serving. You can calculate the number of servings of carbohydrates in one serving by dividing the total carbohydrates by 15. For example, if a food has 30 g of total carbohydrates, it would be equal to 2 servings of carbohydrates.  Check the number of grams (g) of saturated and trans fats in one serving. Choose foods that have low or no amount of these fats.  Check the number of milligrams (mg) of sodium in one serving. Most people should limit total sodium intake to less than 2,300 mg per day.  Always check the nutrition information of foods labeled as "low-fat" or "nonfat". These foods may be higher in added sugar or refined carbohydrates and should be avoided.  Talk to your dietitian to identify your daily goals for nutrients listed on the  label. Shopping  Avoid buying canned, premade, or processed foods. These foods tend to be high in fat, sodium, and added sugar.  Shop around the outside edge of the grocery store. This includes fresh fruits and vegetables, bulk grains, fresh meats, and fresh dairy. Cooking  Use low-heat cooking methods, such as baking, instead of high-heat cooking methods like deep frying.  Cook using healthy oils, such as olive, canola, or sunflower oil.  Avoid cooking with butter, cream, or high-fat meats. Meal planning  Eat meals and snacks regularly, preferably at the same times every day. Avoid going long periods of time without eating.  Eat foods high in fiber, such as fresh fruits, vegetables, beans, and whole grains. Talk to your dietitian about how many servings of carbohydrates you can eat at each meal.  Eat 4-6 ounces of lean protein each day, such as lean meat, chicken, fish, eggs, or tofu. 1 ounce is equal to 1 ounce of meat, chicken, or fish, 1 egg, or 1/4 cup of tofu.  Eat some foods each day that contain healthy fats, such as avocado, nuts, seeds, and fish. Lifestyle   Check your blood glucose regularly.  Exercise at least 30 minutes 5 or more days each week, or as told by your health care provider.  Take medicines as told by your health care provider.  Do not use any products that contain nicotine or tobacco, such as cigarettes and e-cigarettes. If you need help quitting, ask your health care provider.  Work with a Veterinary surgeon or diabetes educator to identify strategies to manage stress and any emotional and social challenges. What are some questions to ask my health care provider?  Do I need to meet with a diabetes educator?  Do I need to meet with a dietitian?  What number can I call if I have questions?  When are the best times to check my blood glucose? Where to find more information:  American Diabetes Association: diabetes.org/food-and-fitness/food  Academy of  Nutrition and Dietetics: https://www.vargas.com/  [employer] of Diabetes and Digestive and Kidney Diseases (NIH): FindJewelers.cz Summary  A healthy meal plan will help you control your blood glucose and maintain a healthy lifestyle.  Working with a diet and nutrition specialist (dietitian) can help you make a meal plan that is best for you.  Keep in mind that carbohydrates and alcohol have immediate effects on your blood glucose levels. It is important to count carbohydrates and to use alcohol carefully. This information is not intended to replace advice given to you by your health care provider. Make sure you discuss any questions you have with your health care provider. Document Released: 12/22/2004 Document Revised: 05/01/2016 Document Reviewed: 05/01/2016 Elsevier Interactive Patient Education  [employer].

## 2017-10-30 ENCOUNTER — Telehealth: Payer: Self-pay | Admitting: Family Medicine

## 2017-10-30 LAB — COMPLETE METABOLIC PANEL WITH GFR
AG RATIO: 1.4 (calc) (ref 1.0–2.5)
ALT: 22 U/L (ref 9–46)
AST: 20 U/L (ref 10–40)
Albumin: 4.4 g/dL (ref 3.6–5.1)
Alkaline phosphatase (APISO): 91 U/L (ref 40–115)
BUN: 16 mg/dL (ref 7–25)
CALCIUM: 9.2 mg/dL (ref 8.6–10.3)
CO2: 25 mmol/L (ref 20–32)
CREATININE: 1.23 mg/dL (ref 0.60–1.35)
Chloride: 105 mmol/L (ref 98–110)
GFR, EST NON AFRICAN AMERICAN: 69 mL/min/{1.73_m2} (ref >=60)
GFR, Est African American: 81 mL/min/{1.73_m2} (ref >=60)
Globulin: 3.2 g/dL (calc) (ref 1.9–3.7)
Glucose, Bld: 86 mg/dL (ref 65–99)
POTASSIUM: 4.3 mmol/L (ref 3.5–5.3)
Sodium: 138 mmol/L (ref 135–146)
Total Bilirubin: 0.7 mg/dL (ref 0.2–1.2)
Total Protein: 7.6 g/dL (ref 6.1–8.1)

## 2017-10-30 LAB — HEMOGLOBIN A1C
HEMOGLOBIN A1C: 5.1 %{Hb} (ref <5.7)
Mean Plasma Glucose: 100 (calc)
eAG (mmol/L): 5.5 (calc)

## 2017-10-30 LAB — MICROALBUMIN / CREATININE URINE RATIO
Creatinine, Urine: 248 mg/dL (ref 20–320)
MICROALB/CREAT RATIO: 64 ug/mg{creat} — AB (ref <30)
Microalb, Ur: 15.9 mg/dL

## 2017-10-30 LAB — LIPID PANEL
CHOLESTEROL: 192 mg/dL (ref <200)
HDL: 27 mg/dL — AB (ref >40)
LDL Cholesterol (Calc): 132 mg/dL (calc) — ABNORMAL HIGH
NON-HDL CHOLESTEROL (CALC): 165 mg/dL — AB (ref <130)
Total CHOL/HDL Ratio: 7.1 (calc) — ABNORMAL HIGH (ref <5.0)
Triglycerides: 193 mg/dL — ABNORMAL HIGH (ref <150)

## 2017-10-30 NOTE — Telephone Encounter (Signed)
Copied from CRM [id number]. Topic: Quick Communication - See Telephone Encounter >> Oct 30, 2017  3:21 PM Arlyss Gandy, NT wrote: CRM for notification. See Telephone encounter for: 10/30/17. Pt calling to get lab results.

## 2017-11-01 NOTE — Telephone Encounter (Signed)
Patient notified

## 2018-04-01 ENCOUNTER — Ambulatory Visit: Payer: BC Managed Care – PPO | Admitting: Family Medicine

## 2018-04-04 ENCOUNTER — Encounter: Payer: Self-pay | Admitting: Family Medicine

## 2018-04-04 ENCOUNTER — Ambulatory Visit (INDEPENDENT_AMBULATORY_CARE_PROVIDER_SITE_OTHER): Payer: BC Managed Care – PPO | Admitting: Family Medicine

## 2018-04-04 VITALS — BP 132/96 | HR 105 | Temp 97.7°F | Resp 16 | Ht 70.0 in | Wt 254.6 lb

## 2018-04-04 DIAGNOSIS — Z1159 Encounter for screening for other viral diseases: Secondary | ICD-10-CM

## 2018-04-04 DIAGNOSIS — E785 Hyperlipidemia, unspecified: Secondary | ICD-10-CM

## 2018-04-04 DIAGNOSIS — R350 Frequency of micturition: Secondary | ICD-10-CM

## 2018-04-04 DIAGNOSIS — E1129 Type 2 diabetes mellitus with other diabetic kidney complication: Secondary | ICD-10-CM | POA: Diagnosis not present

## 2018-04-04 DIAGNOSIS — R809 Proteinuria, unspecified: Secondary | ICD-10-CM

## 2018-04-04 DIAGNOSIS — Z716 Tobacco abuse counseling: Secondary | ICD-10-CM | POA: Diagnosis not present

## 2018-04-04 DIAGNOSIS — Z6835 Body mass index (BMI) 35.0-35.9, adult: Secondary | ICD-10-CM

## 2018-04-04 MED ORDER — LISINOPRIL 2.5 MG PO TABS: 2.5000 mg | ORAL_TABLET | Freq: Every day | ORAL | 2 refills | Status: DC

## 2018-04-04 MED ORDER — ROSUVASTATIN CALCIUM 5 MG PO TABS: 5.0000 mg | ORAL_TABLET | Freq: Every day | ORAL | 2 refills | Status: DC

## 2018-04-04 NOTE — Addendum Note (Signed)
Addended by: Cynda Familia on: 04/04/2018 10:35 AM   Modules accepted: Orders

## 2018-04-04 NOTE — Patient Instructions (Signed)
Please schedule your eye exam ASAP   Diabetes Mellitus and Nutrition, Adult When you have diabetes (diabetes mellitus), it is very important to have healthy eating habits because your blood sugar (glucose) levels are greatly affected by what you eat and drink. Eating healthy foods in the appropriate amounts, at about the same times every day, can help you:  Control your blood glucose.  Lower your risk of heart disease.  Improve your blood pressure.  Reach or maintain a healthy weight. Every person with diabetes is different, and each person has different needs for a meal plan. Your health care provider may recommend that you work with a diet and nutrition specialist (dietitian) to make a meal plan that is best for you. Your meal plan may vary depending on factors such as:  The calories you need.  The medicines you take.  Your weight.  Your blood glucose, blood pressure, and cholesterol levels.  Your activity level.  Other health conditions you have, such as heart or kidney disease. How do carbohydrates affect me? Carbohydrates, also called carbs, affect your blood glucose level more than any other type of food. Eating carbs naturally raises the amount of glucose in your blood. Carb counting is a method for keeping track of how many carbs you eat. Counting carbs is important to keep your blood glucose at a healthy level, especially if you use insulin or take certain oral diabetes medicines. It is important to know how many carbs you can safely have in each meal. This is different for every person. Your dietitian can help you calculate how many carbs you should have at each meal and for each snack. Foods that contain carbs include:  Bread, cereal, rice, pasta, and crackers.  Potatoes and corn.  Peas, beans, and lentils.  Milk and yogurt.  Fruit and juice.  Desserts, such as cakes, cookies, ice cream, and candy. How does alcohol affect me? Alcohol can cause a sudden decrease  in blood glucose (hypoglycemia), especially if you use insulin or take certain oral diabetes medicines. Hypoglycemia can be a life-threatening condition. Symptoms of hypoglycemia (sleepiness, dizziness, and confusion) are similar to symptoms of having too much alcohol. If your health care provider says that alcohol is safe for you, follow these guidelines:  Limit alcohol intake to no more than 1 drink per day for nonpregnant women and 2 drinks per day for men. One drink equals 12 oz of beer, 5 oz of wine, or 1 oz of hard liquor.  Do not drink on an empty stomach.  Keep yourself hydrated with water, diet soda, or unsweetened iced tea.  Keep in mind that regular soda, juice, and other mixers may contain a lot of sugar and must be counted as carbs. What are tips for following this plan?  Reading food labels  Start by checking the serving size on the "Nutrition Facts" label of packaged foods and drinks. The amount of calories, carbs, fats, and other nutrients listed on the label is based on one serving of the item. Many items contain more than one serving per package.  Check the total grams (g) of carbs in one serving. You can calculate the number of servings of carbs in one serving by dividing the total carbs by 15. For example, if a food has 30 g of total carbs, it would be equal to 2 servings of carbs.  Check the number of grams (g) of saturated and trans fats in one serving. Choose foods that have low or no amount  of these fats.  Check the number of milligrams (mg) of salt (sodium) in one serving. Most people should limit total sodium intake to less than 2,300 mg per day.  Always check the nutrition information of foods labeled as "low-fat" or "nonfat". These foods may be higher in added sugar or refined carbs and should be avoided.  Talk to your dietitian to identify your daily goals for nutrients listed on the label. Shopping  Avoid buying canned, premade, or processed foods. These  foods tend to be high in fat, sodium, and added sugar.  Shop around the outside edge of the grocery store. This includes fresh fruits and vegetables, bulk grains, fresh meats, and fresh dairy. Cooking  Use low-heat cooking methods, such as baking, instead of high-heat cooking methods like deep frying.  Cook using healthy oils, such as olive, canola, or sunflower oil.  Avoid cooking with butter, cream, or high-fat meats. Meal planning  Eat meals and snacks regularly, preferably at the same times every day. Avoid going long periods of time without eating.  Eat foods high in fiber, such as fresh fruits, vegetables, beans, and whole grains. Talk to your dietitian about how many servings of carbs you can eat at each meal.  Eat 4-6 ounces (oz) of lean protein each day, such as lean meat, chicken, fish, eggs, or tofu. One oz of lean protein is equal to: ? 1 oz of meat, chicken, or fish. ? 1 egg. ?  cup of tofu.  Eat some foods each day that contain healthy fats, such as avocado, nuts, seeds, and fish. Lifestyle  Check your blood glucose regularly.  Exercise regularly as told by your health care provider. This may include: ? 150 minutes of moderate-intensity or vigorous-intensity exercise each week. This could be brisk walking, biking, or water aerobics. ? Stretching and doing strength exercises, such as yoga or weightlifting, at least 2 times a week.  Take medicines as told by your health care provider.  Do not use any products that contain nicotine or tobacco, such as cigarettes and e-cigarettes. If you need help quitting, ask your health care provider.  Work with a Veterinary surgeoncounselor or diabetes educator to identify strategies to manage stress and any emotional and social challenges. Questions to ask a health care provider  Do I need to meet with a diabetes educator?  Do I need to meet with a dietitian?  What number can I call if I have questions?  When are the best times to check my  blood glucose? Where to find more information:  American Diabetes Association: diabetes.org  Academy of Nutrition and Dietetics: www.eatright.[employer]org  National Institute of Diabetes and Digestive and Kidney Diseases (NIH): CarFlippers.tnwww.niddk.nih.gov Summary  A healthy meal plan will help you control your blood glucose and maintain a healthy lifestyle.  Working with a diet and nutrition specialist (dietitian) can help you make a meal plan that is best for you.  Keep in mind that carbohydrates (carbs) and alcohol have immediate effects on your blood glucose levels. It is important to count carbs and to use alcohol carefully. This information is not intended to replace advice given to you by your health care provider. Make sure you discuss any questions you have with your health care provider. Document Released: 12/22/2004 Document Revised: 10/25/2016 Document Reviewed: 05/01/2016 Elsevier Interactive Patient Education  2019 [employer]

## 2018-04-04 NOTE — Progress Notes (Signed)
Name: Bill Perez   MRN: [id number]    DOB: February 09, 1971   Date:04/04/2018       Progress Note  Subjective  Chief Complaint  Chief Complaint  Patient presents with  . Medication Refill    6 month F/U  . Diabetes    Has not been checking his sugar at home  . Hyperlipidemia  . Obesity  . Nicotine Dependence    Son had accident and has been stressed- start smoking even heavier    HPI   Hyperlipidemia: He has been taking his Crestor daily - ran out about 30 days ago. He denies chest pain, shortness of breath, BLE edema, or myalgias.  Last LDL was not at goal - we will recheck today.  Diabetes: He has been diet controlled, has not had A1C since October 2018.  We will recheck today. Endorses some polyuria.  Denies polydipsia, polyphagia. He is taking 2.22m Lisinopril for kidney protection.  Taking 826mASA - recalculated need for aspirin, guidelines recommend continuing.  Diet has been so-so - not following a particularly strict diabetic diet; doesn't eat many sweets. Due for labs today; urine micro is UTD.  Urinary Frequency: He reports increased urinary frequency during the day only - see below of AUA-7 results, score of 1.  We will check PSA in lab work today, suspect possible related to DM. No abdominal pain, new sexual partners, dysuria, hematuria, or penile discharge. Declines Gon/Chlam testing, will check urine culture and UA  IPSS Questionnaire (AUA-7): Over the past month.   1)  How often have you had a sensation of not emptying your bladder completely after you finish urinating?  0 - Not at all  2)  How often have you had to urinate again less than two hours after you finished urinating? 0 - Not at all  3)  How often have you found you stopped and started again several times when you urinated?  0 - Not at all  4) How difficult have you found it to postpone urination?  1 - Less than 1 time in 5  5) How often have you had a weak urinary stream?  0 - Not at all  6) How  often have you had to push or strain to begin urination?  0 - Not at all  7) How many times did you most typically get up to urinate from the time you went to bed until the time you got up in the morning?  0 - None  Total score:  0-7 mildly symptomatic   8-19 moderately symptomatic   20-35 severely symptomatic  Score of 1.  LEFT Shoulder Pain: started about 2 weeks ago, pain radiates from the left side of his neck/shoulder down to his left hand. Pain is exacerbated by certain movements and putting pressure on the area and feels good for a few days, then flares. He used to pl[employer] Groupand [employer] no old injuries.  Declines meloxicam today.  Tobacco Use: Not ready to quit, still smoking 1 ppd due to increased stress; cessation counseling provided.  Family Stress: Son was in a very bad car accident 03/31/2018 and broke both of his legs - he is leaving here today to go to [place] to be with him.  Obesity: He is not following any particular diet at this time; he is still walking frequently, but says he could improve on his nutrition. 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   Patient Active Problem List   Diagnosis Date Noted  . Tobacco abuse counseling 10/29/2017  . Class 2 severe obesity due to excess calories with serious comorbidity and body mass index (BMI) of 35.0 to 35.9 in adult ([place]) 10/29/2017  . Muscle strain of thigh 03/14/2017  . Dyslipidemia 06/27/2016  . Abdominal wall strain 03/15/2016  . Type 2 diabetes mellitus with microalbuminuria, without long-term current use of insulin ([place]) 10/06/2014   History reviewed. No pertinent surgical history.  Family History  Problem Relation Age of Onset  . Hypertension Mother   . Hyperlipidemia Mother   . Stroke Father   . Diabetes Father     Social History   Socioeconomic History  . Marital status: Single    Spouse name: Not on file  .  Number of children: Not on file  . Years of education: Not on file  . Highest education level: Not on file  Occupational History  . Not on file  Social Needs  . Financial resource strain: Not on file  . Food insecurity:    Worry: Not on file    Inability: Not on file  . Transportation needs:    Medical: Not on file    Non-medical: Not on file  Tobacco Use  . Smoking status: Current Every Day Smoker    Packs/day: 0.50    Years: 25.00    Pack years: 12.50    Types: Cigarettes  . Smokeless tobacco: Never Used  Substance and Sexual Activity  . Alcohol use: Yes    Alcohol/week: 4.0 standard drinks    Types: 4 Glasses of wine per week  . Drug use: Yes  . Sexual activity: Not on file  Lifestyle  . Physical activity:    Days per week: Not on file    Minutes per session: Not on file  . Stress: Not on file  Relationships  . Social connections:    Talks on phone: Not on file    Gets together: Not on file    Attends religious service: Not on file    Active member of club or organization: Not on file    Attends meetings of clubs or organizations: Not on file    Relationship status: Not on file  . Intimate partner violence:    Fear of current or ex partner: Not on file    Emotionally abused: Not on file    Physically abused: Not on file    Forced sexual activity: Not on file  Other Topics Concern  . Not on file  Social History Narrative  . Not on file     Current Outpatient Medications:  .  aspirin 81 MG tablet, Take 81 mg by mouth daily., Disp: , Rfl:  .  Blood Glucose Monitoring Suppl (ONE TOUCH ULTRA MINI) w/Device KIT, See admin instructions., Disp: , Rfl: 0 .  lisinopril (PRINIVIL,ZESTRIL) 2.5 MG tablet, Take 1 tablet (2.5 mg total) by mouth daily., Disp: 90 tablet, Rfl: 0 .  ONE TOUCH ULTRA TEST test strip, , Disp: , Rfl: 0 .  ONETOUCH DELICA LANCETS 56Y MISC, , Disp: , Rfl: 0 .  rosuvastatin (CRESTOR) 5 MG tablet, Take 1 tablet (5 mg total) by mouth daily., Disp: 90  tablet, Rfl: 0  No Known Allergies  I personally reviewed active problem list, medication list, allergies, notes from last encounter, lab results with the patient/caregiver today.   ROS Constitutional: Negative for fever or weight change.  Respiratory:  Negative for cough and shortness of breath.   Cardiovascular: Negative for chest pain or palpitations.  Gastrointestinal: Negative for abdominal pain, no bowel changes.  Musculoskeletal: Negative for gait problem or joint swelling.  Skin: Negative for rash.  Neurological: Negative for dizziness or headache.  No other specific complaints in a complete review of systems (except as listed in HPI above).  Objective  Vitals:   04/04/18 0954  BP: (!) 132/96  Pulse: (!) 105  Resp: 16  Temp: 97.7 F (36.5 C)  TempSrc: Oral  SpO2: 97%  Weight: 254 lb 9.6 oz (115.5 kg)  Height: 5' 10" (1.778 m)   Body mass index is 36.53 kg/m.  Physical Exam  Constitutional: Patient appears well-developed and well-nourished. No distress.  HENT: Head: Normocephalic and atraumatic. Ears: bilateral TMs with no erythema or effusion; Nose: Nose normal. Mouth/Throat: Oropharynx is clear and moist. No oropharyngeal exudate or tonsillar swelling.  Eyes: Conjunctivae and EOM are normal. No scleral icterus.  Pupils are equal, round, and reactive to light.  Neck: Normal range of motion. Neck supple. No JVD present. No thyromegaly present.  Cardiovascular: Normal rate, regular rhythm and normal heart sounds.  No murmur heard. No BLE edema. Pulmonary/Chest: Effort normal and breath sounds normal. No respiratory distress. Abdominal: Soft. Bowel sounds are normal, no distension. There is no tenderness. No masses. Musculoskeletal: Normal range of motion, no joint effusions. No gross deformities Neurological: Pt is alert and oriented to person, place, and time. No cranial nerve deficit. Coordination, balance, strength, speech and gait are normal.  Skin: Skin is warm  and dry. No rash noted. No erythema.  Psychiatric: Patient has a normal mood and affect. behavior is normal. Judgment and thought content normal.  No results found for this or any previous visit (from the past 72 hour(s)).  PHQ2/9: Depression screen [facility] 2/9 04/04/2018 10/29/2017 09/13/2016 03/03/2015 10/15/2014  Decreased Interest 0 0 0 0 0  Down, Depressed, Hopeless 0 0 0 0 0  PHQ - 2 Score 0 0 0 0 0  Altered sleeping 0 0 - - -  Tired, decreased energy 0 0 - - -  Change in appetite 0 0 - - -  Feeling bad or failure about yourself  0 0 - - -  Trouble concentrating 0 0 - - -  Moving slowly or fidgety/restless 0 0 - - -  Suicidal thoughts 0 0 - - -  PHQ-9 Score 0 0 - - -  Difficult doing work/chores Not difficult at all - - - -   Fall Risk: Fall Risk  04/04/2018 10/29/2017 09/13/2016 03/03/2015 10/15/2014  Falls in the past year? 0 No No No No    Functional Status Survey: Is the patient deaf or have difficulty hearing?: No Does the patient have difficulty seeing, even when wearing glasses/contacts?: Yes(glasses) Does the patient have difficulty concentrating, remembering, or making decisions?: No Does the patient have difficulty walking or climbing stairs?: No Does the patient have difficulty dressing or bathing?: No Does the patient have difficulty doing errands alone such as visiting a doctor's office or shopping?: No  Assessment & Plan  1. Type 2 diabetes mellitus with microalbuminuria, without long-term current use of insulin (HCC) - Hemoglobin A1c - COMPLETE METABOLIC PANEL WITH GFR; Future - lisinopril (PRINIVIL,ZESTRIL) 2.5 MG tablet; Take 1 tablet (2.5 mg total) by mouth daily.  Dispense: 90 tablet; Refill: 2  2. Dyslipidemia - Lipid panel - rosuvastatin (CRESTOR) 5 MG tablet; Take 1 tablet (5 mg total) by mouth daily.  Dispense: 90 tablet; Refill: 2  3. Class 2 severe obesity due to excess calories with serious comorbidity and body mass index (BMI) of 35.0 to 35.9 in adult  (HCC) - COMPLETE METABOLIC PANEL WITH GFR; Future  4. Tobacco abuse counseling - Not ready to quit.  5. Urinary frequency - PSA - Urine Culture - Urinalysis  6. Need for hepatitis C screening test - Hepatitis C antibody

## 2018-04-05 ENCOUNTER — Other Ambulatory Visit: Payer: Self-pay | Admitting: Family Medicine

## 2018-04-05 DIAGNOSIS — Z6835 Body mass index (BMI) 35.0-35.9, adult: Secondary | ICD-10-CM

## 2018-04-05 DIAGNOSIS — E785 Hyperlipidemia, unspecified: Secondary | ICD-10-CM

## 2018-04-05 DIAGNOSIS — E1129 Type 2 diabetes mellitus with other diabetic kidney complication: Secondary | ICD-10-CM

## 2018-04-05 DIAGNOSIS — R809 Proteinuria, unspecified: Principal | ICD-10-CM

## 2018-04-05 LAB — URINALYSIS
Bilirubin Urine: NEGATIVE
Glucose, UA: NEGATIVE
Hgb urine dipstick: NEGATIVE
Ketones, ur: NEGATIVE
Leukocytes, UA: NEGATIVE
Nitrite: NEGATIVE
Specific Gravity, Urine: 1.024 (ref 1.001–1.03)
pH: 5.5 (ref 5.0–8.0)

## 2018-04-05 LAB — LIPID PANEL
Cholesterol: 184 mg/dL (ref <200)
HDL: 26 mg/dL — ABNORMAL LOW (ref >40)
LDL Cholesterol (Calc): 128 mg/dL (calc) — ABNORMAL HIGH
NON-HDL CHOLESTEROL (CALC): 158 mg/dL — AB (ref <130)
Total CHOL/HDL Ratio: 7.1 (calc) — ABNORMAL HIGH (ref <5.0)
Triglycerides: 187 mg/dL — ABNORMAL HIGH (ref <150)

## 2018-04-05 LAB — COMPLETE METABOLIC PANEL WITH GFR
AG Ratio: 1.3 (calc) (ref 1.0–2.5)
ALT: 24 U/L (ref 9–46)
AST: 22 U/L (ref 10–40)
Albumin: 4.3 g/dL (ref 3.6–5.1)
Alkaline phosphatase (APISO): 102 U/L (ref 40–115)
BUN: 16 mg/dL (ref 7–25)
CO2: 28 mmol/L (ref 20–32)
Calcium: 9 mg/dL (ref 8.6–10.3)
Chloride: 104 mmol/L (ref 98–110)
Creat: 1.24 mg/dL (ref 0.60–1.35)
GFR, Est African American: 80 mL/min/{1.73_m2} (ref >=60)
GFR, Est Non African American: 69 mL/min/{1.73_m2} (ref >=60)
GLOBULIN: 3.4 g/dL (ref 1.9–3.7)
Glucose, Bld: 100 mg/dL — ABNORMAL HIGH (ref 65–99)
Potassium: 4.3 mmol/L (ref 3.5–5.3)
SODIUM: 139 mmol/L (ref 135–146)
Total Bilirubin: 0.8 mg/dL (ref 0.2–1.2)
Total Protein: 7.7 g/dL (ref 6.1–8.1)

## 2018-04-05 LAB — URINE CULTURE
MICRO NUMBER:: 91540940
Result:: NO GROWTH
SPECIMEN QUALITY:: ADEQUATE

## 2018-04-05 LAB — HEPATITIS C ANTIBODY
Hepatitis C Ab: NONREACTIVE
SIGNAL TO CUT-OFF: 0.12 (ref <1.00)

## 2018-04-05 LAB — PSA: PSA: 0.3 ng/mL (ref <=4.0)

## 2018-04-05 MED ORDER — ROSUVASTATIN CALCIUM 20 MG PO TABS: 20.0000 mg | ORAL_TABLET | Freq: Every day | ORAL | 1 refills | Status: DC

## 2018-04-05 NOTE — Addendum Note (Signed)
Addended by: Cynda FamiliaJOHNSON, Cambri Plourde L on: 04/05/2018 03:09 PM   Modules accepted: Orders

## 2018-09-20 ENCOUNTER — Encounter (INDEPENDENT_AMBULATORY_CARE_PROVIDER_SITE_OTHER): Payer: Self-pay

## 2018-09-20 ENCOUNTER — Other Ambulatory Visit: Payer: Self-pay

## 2018-09-20 ENCOUNTER — Encounter: Payer: Self-pay | Admitting: Family Medicine

## 2018-09-20 ENCOUNTER — Ambulatory Visit (INDEPENDENT_AMBULATORY_CARE_PROVIDER_SITE_OTHER): Payer: No Typology Code available for payment source | Admitting: Family Medicine

## 2018-09-20 VITALS — BP 132/76 | HR 100 | Temp 98.8°F | Resp 16 | Ht 70.0 in | Wt 253.8 lb

## 2018-09-20 DIAGNOSIS — E1129 Type 2 diabetes mellitus with other diabetic kidney complication: Secondary | ICD-10-CM | POA: Diagnosis not present

## 2018-09-20 DIAGNOSIS — E785 Hyperlipidemia, unspecified: Secondary | ICD-10-CM | POA: Diagnosis not present

## 2018-09-20 DIAGNOSIS — Z6835 Body mass index (BMI) 35.0-35.9, adult: Secondary | ICD-10-CM

## 2018-09-20 DIAGNOSIS — R809 Proteinuria, unspecified: Secondary | ICD-10-CM

## 2018-09-20 DIAGNOSIS — M542 Cervicalgia: Secondary | ICD-10-CM

## 2018-09-20 DIAGNOSIS — E1142 Type 2 diabetes mellitus with diabetic polyneuropathy: Secondary | ICD-10-CM

## 2018-09-20 DIAGNOSIS — Z0000 Encounter for general adult medical examination without abnormal findings: Secondary | ICD-10-CM

## 2018-09-20 DIAGNOSIS — Z716 Tobacco abuse counseling: Secondary | ICD-10-CM

## 2018-09-20 MED ORDER — GABAPENTIN 100 MG PO CAPS: 100.0000 mg | ORAL_CAPSULE | Freq: Every day | ORAL | 0 refills | Status: DC

## 2018-09-20 MED ORDER — CYCLOBENZAPRINE HCL 10 MG PO TABS: 5.0000 mg | ORAL_TABLET | Freq: Three times a day (TID) | ORAL | 0 refills | Status: DC | PRN

## 2018-09-20 MED ORDER — ROSUVASTATIN CALCIUM 20 MG PO TABS: 20.0000 mg | ORAL_TABLET | Freq: Every day | ORAL | 1 refills | Status: DC

## 2018-09-20 NOTE — Progress Notes (Signed)
Name: Bill Perez   MRN: [id number]    DOB: [birth year]   Date:09/20/2018       Progress Note  Subjective  Chief Complaint  Chief Complaint  Patient presents with  . Diabetes  . Hyperlipidemia    HPI  Patient presents for annual CPE and follow up.   Hyperlipidemia: He has been taking his Crestor daily at 59m. He denies chest pain, shortness of breath, BLE edema, or myalgias. Last LDL was not at goal - we will recheck today  Diabetes: He has been diet controlled, has not had A1C since October 2018 - was supposed to have at last check but did not get this done. We will recheck today. Polyuria has resolved. Denies polydipsia, polyphagia. He is taking 2.539mLisinopril for kidney protection. Taking 8130mSA - recalculated need for aspirin, guidelines recommend continuing. Diet has been so-so - not following a particularly strict diabetic diet; doesn't eat many sweets. Due for labs today; urine micro is UTD. BG's have been in the 100-110's.  Does endorse bilateral foot pain - the pain is along the pad, arch, and heel bilaterally and says it feels like they are a bit "dry" and may have a bit of a tingle. We will trial low dose gabapentin.  LEFT Neck and shoulderPain: started several months ago, pain no longer radiates, and neck pain has essentially resolved.Pain is exacerbated by certain movements and putting pressure on the area and feels good for a few days, then flares. He used to pla[employer] International[employer] Groupnd [employer] no old injuries.  We will provide flexeril today to help with this.    Tobacco Use: Not ready to quit, still smoking 1 ppd due to increased stress; cessation counseling provided. Unchanged.   Family Stress: Son was in a very bad car accident 03/31/2018 and broke both of his legs.  His son is riding a bike now and they are planning to start riding bikes today.   Obesity: He is not following any particular diet at this time; he is still walking frequently, but  says he could improve on his nutrition.Weight stable today.  More active in the summers.   USPSTF grade A and B recommendations:   Depression: phq 9 is negative [facility] screen [facility] 09/20/2018 04/04/2018 10/29/2017 09/13/2016 03/03/2015  Decreased Interest 0 0 0 0 0  Down, Depressed, Hopeless 0 0 0 0 0  PHQ - 2 Score 0 0 0 0 0  Altered sleeping 0 0 0 - -  Tired, decreased energy 0 0 0 - -  Change in appetite 0 0 0 - -  Feeling bad or failure about yourself  0 0 0 - -  Trouble concentrating 0 0 0 - -  Moving slowly or fidgety/restless 0 0 0 - -  Suicidal thoughts 0 0 0 - -  PHQ-9 Score 0 0 0 - -  Difficult doing work/chores - Not difficult at all - - -    Hypertension:  BP Readings from Last 3 Encounters:  09/20/18 132/76  04/04/18 (!) 132/96  10/29/17 130/80    Obesity: Wt Readings from Last 3 Encounters:  09/20/18 253 lb 12.8 oz (115.1 kg)  04/04/18 254 lb 9.6 oz (115.5 kg)  10/29/17 249 lb 3.2 oz (113 kg)   BMI Readings from Last 3 Encounters:  09/20/18 36.42 kg/m  04/04/18 36.53 kg/m  10/29/17 35.76 kg/m     Lipids:  Lab Results  Component Value Date   CHOL 184 04/04/2018  CHOL 192 10/29/2017   CHOL 114 04/06/2017   Lab Results  Component Value Date   HDL 26 (L) 04/04/2018   HDL 27 (L) 10/29/2017   HDL 27 (L) 04/06/2017   Lab Results  Component Value Date   LDLCALC 128 (H) 04/04/2018   LDLCALC 132 (H) 10/29/2017   LDLCALC 66 04/06/2017   Lab Results  Component Value Date   TRIG 187 (H) 04/04/2018   TRIG 193 (H) 10/29/2017   TRIG 127 04/06/2017   Lab Results  Component Value Date   CHOLHDL 7.1 (H) 04/04/2018   CHOLHDL 7.1 (H) 10/29/2017   CHOLHDL 4.2 04/06/2017   No results found for: LDLDIRECT Glucose: We will check today Glucose, Bld  Date Value Ref Range Status  04/04/2018 100 (H) 65 - 99 mg/dL Final    Comment:    .            Fasting reference interval . For someone without known diabetes, a glucose value between 100 and  125 mg/dL is consistent with prediabetes and should be confirmed with a follow-up test. .   10/29/2017 86 65 - 99 mg/dL Final    Comment:    .            Fasting reference interval .   04/06/2017 94 65 - 99 mg/dL Final    Comment:    .            Fasting reference interval .       Office Visit from 10/29/2017 in [facility]  AUDIT-C Score  1     Married STD testing and prevention (HIV/chl/gon/syphilis): No new partners in the last year; declines Hep C: Negative in December 2019  Skin cancer: No concerning lesions Colorectal cancer: Denies family or personal history of colorectal cancer, no changes in BM's - no blood in stool, dark and tarry stool, mucus in stool, or constipation/diarrhea. Wants to wait until 50 for screening.  Prostate cancer:  Lab Results  Component Value Date   PSA 0.3 04/04/2018   IPSS Questionnaire (AUA-7): Over the past month.   1)  How often have you had a sensation of not emptying your bladder completely after you finish urinating?  0 - Not at all  2)  How often have you had to urinate again less than two hours after you finished urinating? 0 - Not at all  3)  How often have you found you stopped and started again several times when you urinated?  1 - Less than 1 time in 5  4) How difficult have you found it to postpone urination?  0 - Not at all  5) How often have you had a weak urinary stream?  0 - Not at all  6) How often have you had to push or strain to begin urination?  0 - Not at all  7) How many times did you most typically get up to urinate from the time you went to bed until the time you got up in the morning?  0 - None  Total score:  0-7 mildly symptomatic   8-19 moderately symptomatic   20-35 severely symptomatic  Score of 1 - stable from last check  Lung cancer: Cessation recommendation. Low Dose CT Chest recommended if Age 69-80 years, 30 pack-year currently smoking OR have quit w/in 15years. Patient does not  qualify.   AAA:  The USPSTF recommends one-time screening with ultrasonography in men ages 39 to 33 years who have  ever smoked ECG:  Denies chest pain, shortness of breath, or palpitations.  Advanced Care Planning: A voluntary discussion about advance care planning including the explanation and discussion of advance directives.  Discussed health care proxy and Living will, and the patient was able to identify a health care proxy as Wife - Hani Campusano.  Patient does not have a living will at present time. If patient does have living will, I have requested they bring this to the clinic to be scanned in to their chart.  Patient Active Problem List   Diagnosis Date Noted  . Tobacco abuse counseling 10/29/2017  . Class 2 severe obesity due to excess calories with serious comorbidity and body mass index (BMI) of 35.0 to 35.9 in adult ([place]) 10/29/2017  . Muscle strain of thigh 03/14/2017  . Dyslipidemia 06/27/2016  . Abdominal wall strain 03/15/2016  . Type 2 diabetes mellitus with microalbuminuria, without long-term current use of insulin ([place]) 10/06/2014    History reviewed. No pertinent surgical history.  Family History  Problem Relation Age of Onset  . Hypertension Mother   . Hyperlipidemia Mother   . Stroke Father   . Diabetes Father     Social History   Socioeconomic History  . Marital status: Married    Spouse name: Not on file  . Number of children: 4  . Years of education: Not on file  . Highest education level: Not on file  Occupational History  . Not on file  Social Needs  . Financial resource strain: Not hard at all  . Food insecurity    Worry: Never true    Inability: Never true  . Transportation needs    Medical: Not on file    Non-medical: No  Tobacco Use  . Smoking status: Current Every Day Smoker    Packs/day: 0.50    Years: 25.00    Pack years: 12.50    Types: Cigarettes  . Smokeless tobacco: Never Used  Substance and Sexual Activity  . Alcohol use:  Yes    Alcohol/week: 4.0 standard drinks    Types: 4 Glasses of wine per week  . Drug use: Yes  . Sexual activity: Yes    Partners: Female  Lifestyle  . Physical activity    Days per week: 2 days    Minutes per session: 30 min  . Stress: Only a little  Relationships  . Social connections    Talks on phone: More than three times a week    Gets together: More than three times a week    Attends religious service: 1 to 4 times per year    Active member of club or organization: Yes    Attends meetings of clubs or organizations: More than 4 times per year    Relationship status: Married  . Intimate partner violence    Fear of current or ex partner: No    Emotionally abused: No    Physically abused: No    Forced sexual activity: No  Other Topics Concern  . Not on file  Social History Narrative  . Not on file     Current Outpatient Medications:  .  aspirin 81 MG tablet, Take 81 mg by mouth daily., Disp: , Rfl:  .  Blood Glucose Monitoring Suppl (ONE TOUCH ULTRA MINI) w/Device KIT, See admin instructions., Disp: , Rfl: 0 .  lisinopril (PRINIVIL,ZESTRIL) 2.5 MG tablet, Take 1 tablet (2.5 mg total) by mouth daily., Disp: 90 tablet, Rfl: 2 .  ONE TOUCH ULTRA TEST  test strip, , Disp: , Rfl: 0 .  ONETOUCH DELICA LANCETS 42A MISC, , Disp: , Rfl: 0 .  rosuvastatin (CRESTOR) 20 MG tablet, Take 1 tablet (20 mg total) by mouth daily., Disp: 90 tablet, Rfl: 1  No Known Allergies   ROS  Constitutional: Negative for fever or weight change.  Respiratory: Negative for cough and shortness of breath.   Cardiovascular: Negative for chest pain or palpitations.  Gastrointestinal: Negative for abdominal pain, no bowel changes.  Musculoskeletal: Negative for gait problem or joint swelling.  Skin: Negative for rash.  Neurological: Negative for dizziness or headache.  No other specific complaints in a complete review of systems (except as listed in HPI above).  Objective  Vitals:   09/20/18  0744  BP: 132/76  Pulse: 100  Resp: 16  Temp: 98.8 F (37.1 C)  TempSrc: Oral  SpO2: 97%  Weight: 253 lb 12.8 oz (115.1 kg)  Height: _0  (1.778 m)    Body mass index is 36.42 kg/m.  Physical Exam Constitutional: Patient appears well-developed and well-nourished. No distress.  HENT: Head: Normocephalic and atraumatic. Ears: B TMs ok, no erythema or effusion; Nose: Nose normal. Mouth/Throat: Oropharynx is clear and moist. No oropharyngeal exudate.  Eyes: Conjunctivae and EOM are normal. Pupils are equal, round, and reactive to light. No scleral icterus.  Neck: Normal range of motion. Neck supple. No JVD present. No thyromegaly present.  Cardiovascular: Normal rate, regular rhythm and normal heart sounds.  No murmur heard. No BLE edema. Pulmonary/Chest: Effort normal and breath sounds normal. No respiratory distress. Abdominal: Soft. Bowel sounds are normal, no distension. There is no tenderness. no masses MALE GENITALIA: Deferred. Musculoskeletal: Normal range of motion, no joint effusions. No gross deformities Neurological: he is alert and oriented to person, place, and time. No cranial nerve deficit. Coordination, balance, strength, speech and gait are normal.  Skin: Skin is warm and dry. No rash noted. No erythema.  Psychiatric: Patient has a normal mood and affect. behavior is normal. Judgment and thought content normal.  No results found for this or any previous visit (from the past 2160 hour(s)).  PHQ2/9: Depression screen [facility] 2/9 09/20/2018 04/04/2018 10/29/2017 09/13/2016 03/03/2015  Decreased Interest 0 0 0 0 0  Down, Depressed, Hopeless 0 0 0 0 0  PHQ - 2 Score 0 0 0 0 0  Altered sleeping 0 0 0 - -  Tired, decreased energy 0 0 0 - -  Change in appetite 0 0 0 - -  Feeling bad or failure about yourself  0 0 0 - -  Trouble concentrating 0 0 0 - -  Moving slowly or fidgety/restless 0 0 0 - -  Suicidal thoughts 0 0 0 - -  PHQ-9 Score 0 0 0 - -  Difficult doing  work/chores - Not difficult at all - - -   Fall Risk: Fall Risk  09/20/2018 04/04/2018 10/29/2017 09/13/2016 03/03/2015  Falls in the past year? 0 0 No No No  Number falls in past yr: 0 - - - -  Injury with Fall? 0 - - - -   Functional Status Survey: Is the patient deaf or have difficulty hearing?: No Does the patient have difficulty seeing, even when wearing glasses/contacts?: No Does the patient have difficulty concentrating, remembering, or making decisions?: No Does the patient have difficulty walking or climbing stairs?: No Does the patient have difficulty dressing or bathing?: No Does the patient have difficulty doing errands alone such as visiting a doctor's office  or shopping?: No   Assessment & Plan  1. Encounter for annual physical exam -Prostate cancer screening and PSA options (with potential risks and benefits of testing vs not testing) were discussed along with recent recs/guidelines. -USPSTF grade A and B recommendations reviewed with patient; age-appropriate recommendations, preventive care, screening tests, etc discussed and encouraged; healthy living encouraged; see AVS for patient education given to patient -Discussed importance of 150 minutes of physical activity weekly, eat two servings of fish weekly, eat one serving of tree nuts ( cashews, pistachios, pecans, almonds.Marland Kitchen) every other day, eat 6 servings of fruit/vegetables daily and drink plenty of water and avoid sweet beverages.   2. Type 2 diabetes mellitus with microalbuminuria, without long-term current use of insulin (HCC) - rosuvastatin (CRESTOR) 20 MG tablet; Take 1 tablet (20 mg total) by mouth daily.  Dispense: 90 tablet; Refill: 1 - Hemoglobin A1c - COMPLETE METABOLIC PANEL WITH GFR  3. Dyslipidemia - rosuvastatin (CRESTOR) 20 MG tablet; Take 1 tablet (20 mg total) by mouth daily.  Dispense: 90 tablet; Refill: 1 - Lipid panel  4. Neck pain on left side - cyclobenzaprine (FLEXERIL) 10 MG tablet; Take 0.5-1  tablets (5-10 mg total) by mouth 3 (three) times daily as needed for muscle spasms.  Dispense: 30 tablet; Refill: 0  5. Class 2 severe obesity due to excess calories with serious comorbidity and body mass index (BMI) of 35.0 to 35.9 in adult [facility]) - See above regarding teaching  6. Tobacco abuse counseling - Not ready to quit  7. Diabetic polyneuropathy associated with type 2 diabetes mellitus (HCC) - gabapentin (NEURONTIN) 100 MG capsule; Take 1 capsule (100 mg total) by mouth at bedtime.  Dispense: 90 capsule; Refill: 0 - Trial to see if effective for foot pain.

## 2018-09-20 NOTE — Patient Instructions (Signed)
Preventive Care 40-64 Years, Male Preventive care refers to lifestyle choices and visits with your health care provider that can promote health and wellness. What does preventive care include?   A yearly physical exam. This is also called an annual well check.  Dental exams once or twice a year.  Routine eye exams. Ask your health care provider how often you should have your eyes checked.  Personal lifestyle choices, including: ? Daily care of your teeth and gums. ? Regular physical activity. ? Eating a healthy diet. ? Avoiding tobacco and drug use. ? Limiting alcohol use. ? Practicing safe sex. ? Taking low-dose aspirin every day starting at age 50. What happens during an annual well check? The services and screenings done by your health care provider during your annual well check will depend on your age, overall health, lifestyle risk factors, and family history of disease. Counseling Your health care provider may ask you questions about your:  Alcohol use.  Tobacco use.  Drug use.  Emotional well-being.  Home and relationship well-being.  Sexual activity.  Eating habits.  Work and work environment. Screening You may have the following tests or measurements:  Height, weight, and BMI.  Blood pressure.  Lipid and cholesterol levels. These may be checked every 5 years, or more frequently if you are over 50 years old.  Skin check.  Lung cancer screening. You may have this screening every year starting at age 55 if you have a 30-pack-year history of smoking and currently smoke or have quit within the past 15 years.  Colorectal cancer screening. All adults should have this screening starting at age 50 and continuing until age 75. Your health care provider may recommend screening at age 45. You will have tests every 1-10 years, depending on your results and the type of screening test. People at increased risk should start screening at an earlier age. Screening tests may  include: ? Guaiac-based fecal occult blood testing. ? Fecal immunochemical test (FIT). ? Stool DNA test. ? Virtual colonoscopy. ? Sigmoidoscopy. During this test, a flexible tube with a tiny camera (sigmoidoscope) is used to examine your rectum and lower colon. The sigmoidoscope is inserted through your anus into your rectum and lower colon. ? Colonoscopy. During this test, a long, thin, flexible tube with a tiny camera (colonoscope) is used to examine your entire colon and rectum.  Prostate cancer screening. Recommendations will vary depending on your family history and other risks.  Hepatitis C blood test.  Hepatitis B blood test.  Sexually transmitted disease (STD) testing.  Diabetes screening. This is done by checking your blood sugar (glucose) after you have not eaten for a while (fasting). You may have this done every 1-3 years. Discuss your test results, treatment options, and if necessary, the need for more tests with your health care provider. Vaccines Your health care provider may recommend certain vaccines, such as:  Influenza vaccine. This is recommended every year.  Tetanus, diphtheria, and acellular pertussis (Tdap, Td) vaccine. You may need a Td booster every 10 years.  Varicella vaccine. You may need this if you have not been vaccinated.  Zoster vaccine. You may need this after age 60.  Measles, mumps, and rubella (MMR) vaccine. You may need at least one dose of MMR if you were born in 1957 or later. You may also need a second dose.  Pneumococcal 13-valent conjugate (PCV13) vaccine. You may need this if you have certain conditions and have not been vaccinated.  Pneumococcal polysaccharide (PPSV23) vaccine.   and rubella (MMR) vaccine. You may need at least one dose of MMR if you were born in 1957 or later. You may also need a second dose.   Pneumococcal 13-valent conjugate (PCV13) vaccine. You may need this if you have certain conditions and have not been vaccinated.   Pneumococcal polysaccharide (PPSV23) vaccine. You may need one or two doses if you smoke cigarettes or if you have certain conditions.   Meningococcal vaccine. You may need this if you have certain conditions.   Hepatitis A vaccine. You may need this if you have certain conditions or if you travel or work in  places where you may be exposed to hepatitis A.   Hepatitis B vaccine. You may need this if you have certain conditions or if you travel or work in places where you may be exposed to hepatitis B.   Haemophilus influenzae type b (Hib) vaccine. You may need this if you have certain risk factors.  Talk to your health care provider about which screenings and vaccines you need and how often you need them.  This information is not intended to replace advice given to you by your health care provider. Make sure you discuss any questions you have with your health care provider.  Document Released: 04/23/2015 Document Revised: 05/17/2017 Document Reviewed: 01/26/2015  Elsevier Interactive Patient Education  2019 Elsevier Inc.

## 2018-09-21 LAB — COMPLETE METABOLIC PANEL WITH GFR
AG Ratio: 1.3 (calc) (ref 1.0–2.5)
ALT: 24 U/L (ref 9–46)
AST: 19 U/L (ref 10–40)
Albumin: 4.1 g/dL (ref 3.6–5.1)
Alkaline phosphatase (APISO): 100 U/L (ref 36–130)
BUN: 12 mg/dL (ref 7–25)
CO2: 27 mmol/L (ref 20–32)
Calcium: 9.1 mg/dL (ref 8.6–10.3)
Chloride: 106 mmol/L (ref 98–110)
Creat: 1.31 mg/dL (ref 0.60–1.35)
GFR, Est African American: 74 mL/min/{1.73_m2} (ref >=60)
GFR, Est Non African American: 64 mL/min/{1.73_m2} (ref >=60)
Globulin: 3.2 g/dL (calc) (ref 1.9–3.7)
Glucose, Bld: 110 mg/dL — ABNORMAL HIGH (ref 65–99)
Potassium: 4.9 mmol/L (ref 3.5–5.3)
Sodium: 140 mmol/L (ref 135–146)
Total Bilirubin: 0.6 mg/dL (ref 0.2–1.2)
Total Protein: 7.3 g/dL (ref 6.1–8.1)

## 2018-09-21 LAB — HEMOGLOBIN A1C
Hgb A1c MFr Bld: 5.6 % of total Hgb (ref <5.7)
Mean Plasma Glucose: 114 (calc)
eAG (mmol/L): 6.3 (calc)

## 2018-09-21 LAB — LIPID PANEL
Cholesterol: 125 mg/dL (ref <200)
HDL: 24 mg/dL — ABNORMAL LOW (ref >=40)
LDL Cholesterol (Calc): 78 mg/dL (calc)
Non-HDL Cholesterol (Calc): 101 mg/dL (calc) (ref <130)
Total CHOL/HDL Ratio: 5.2 (calc) — ABNORMAL HIGH (ref <5.0)
Triglycerides: 132 mg/dL (ref <150)

## 2018-10-04 ENCOUNTER — Ambulatory Visit: Payer: BC Managed Care – PPO | Admitting: Family Medicine

## 2019-02-14 ENCOUNTER — Other Ambulatory Visit: Payer: Self-pay | Admitting: *Deleted

## 2019-02-14 DIAGNOSIS — Z20822 Contact with and (suspected) exposure to covid-19: Secondary | ICD-10-CM

## 2019-02-16 LAB — NOVEL CORONAVIRUS, NAA: SARS-CoV-2, NAA: NOT DETECTED

## 2019-02-25 ENCOUNTER — Other Ambulatory Visit: Payer: Self-pay

## 2019-02-25 ENCOUNTER — Other Ambulatory Visit: Payer: Self-pay | Admitting: Family Medicine

## 2019-02-25 ENCOUNTER — Ambulatory Visit (LOCAL_COMMUNITY_HEALTH_CENTER): Payer: No Typology Code available for payment source

## 2019-02-25 DIAGNOSIS — E1129 Type 2 diabetes mellitus with other diabetic kidney complication: Secondary | ICD-10-CM

## 2019-02-25 DIAGNOSIS — Z111 Encounter for screening for respiratory tuberculosis: Secondary | ICD-10-CM

## 2019-02-25 DIAGNOSIS — R809 Proteinuria, unspecified: Secondary | ICD-10-CM

## 2019-02-28 ENCOUNTER — Other Ambulatory Visit: Payer: Self-pay

## 2019-02-28 ENCOUNTER — Ambulatory Visit (LOCAL_COMMUNITY_HEALTH_CENTER): Payer: Self-pay

## 2019-02-28 DIAGNOSIS — Z111 Encounter for screening for respiratory tuberculosis: Secondary | ICD-10-CM

## 2019-02-28 LAB — TB SKIN TEST
Induration: 0 mm
TB Skin Test: NEGATIVE

## 2019-03-01 ENCOUNTER — Other Ambulatory Visit: Payer: Self-pay | Admitting: Family Medicine

## 2019-03-01 DIAGNOSIS — E785 Hyperlipidemia, unspecified: Secondary | ICD-10-CM

## 2019-03-04 ENCOUNTER — Other Ambulatory Visit: Payer: Self-pay

## 2019-03-04 ENCOUNTER — Telehealth: Payer: Self-pay | Admitting: Emergency Medicine

## 2019-03-04 DIAGNOSIS — Z20822 Contact with and (suspected) exposure to covid-19: Secondary | ICD-10-CM

## 2019-03-04 NOTE — Telephone Encounter (Signed)
Copied from [place] [id number]. Topic: General - Other >> Mar 03, 2019  2:25 PM Greggory Keen D wrote: Reason for CRM: pt called saying he has been taking Crestor 5 mg and when he picked the rx up from the pharmacy it was Crestor 20 mg.  He wants to know why it has been changed.  Best # [phone]

## 2019-03-04 NOTE — Telephone Encounter (Signed)
Called patient and he stated he has always been taking 5 mg Crestor. I went back to his labs in June and it was changed to 20mg  and the prescription was sent. I explained to patient he may have been refilling a old prescription since he did not medication regular. Is it okey to jump from the 5mg  to 20 mg

## 2019-03-05 LAB — NOVEL CORONAVIRUS, NAA: SARS-CoV-2, NAA: NOT DETECTED

## 2019-03-05 NOTE — Telephone Encounter (Signed)
I switched him to the 20mg  Crestor back in December 2019.  Please verify with the pharmacy if he has been receiving the incorrect dosing.  If he has been given/been taking the 5mg  dosing, I will resend that Rx because his lipids were doing well at last check.  I had attributed his improvement in his LDL in June to the higher dose at 20mg  - please verify with the pharmacy what they have been filling for him.

## 2019-03-07 MED ORDER — ROSUVASTATIN CALCIUM 5 MG PO TABS: 5.0000 mg | ORAL_TABLET | Freq: Every day | ORAL | 3 refills | Status: DC

## 2019-03-07 NOTE — Telephone Encounter (Signed)
Patient was getting 5 mg up to November 20th

## 2019-03-07 NOTE — Telephone Encounter (Signed)
5mg  sent in x90 day supplies.

## 2019-03-10 NOTE — Telephone Encounter (Signed)
Patient requesting call back from PCP or CMA to discuss how to get refund for the initial incorrect RX he picked up from pharmacy. Patient has spoken with pharmacy and was advised to contact PCP.

## 2019-03-11 NOTE — Telephone Encounter (Signed)
Called patient left message

## 2019-03-11 NOTE — Telephone Encounter (Signed)
Pt calling back. Please call back.  °

## 2019-03-12 NOTE — Telephone Encounter (Signed)
That's fine to do

## 2019-03-12 NOTE — Telephone Encounter (Signed)
I explained to patient as of 03/29/18 he should have been on 20mg  of Crestor per labs. Can he cut the crestor in half and take half every other day until gone so he will not loose any money on medication.

## 2019-03-12 NOTE — Telephone Encounter (Signed)
Patient notified

## 2019-03-13 ENCOUNTER — Other Ambulatory Visit: Payer: Self-pay

## 2019-03-13 DIAGNOSIS — Z20822 Contact with and (suspected) exposure to covid-19: Secondary | ICD-10-CM

## 2019-03-16 LAB — NOVEL CORONAVIRUS, NAA: SARS-CoV-2, NAA: NOT DETECTED

## 2019-03-31 ENCOUNTER — Other Ambulatory Visit: Payer: Self-pay

## 2019-03-31 ENCOUNTER — Encounter: Payer: Self-pay | Admitting: Family Medicine

## 2019-03-31 ENCOUNTER — Ambulatory Visit: Payer: No Typology Code available for payment source | Admitting: Family Medicine

## 2019-03-31 VITALS — BP 120/80 | HR 100 | Temp 97.6°F | Resp 18 | Ht 70.0 in | Wt 258.6 lb

## 2019-03-31 DIAGNOSIS — E1142 Type 2 diabetes mellitus with diabetic polyneuropathy: Secondary | ICD-10-CM

## 2019-03-31 DIAGNOSIS — M542 Cervicalgia: Secondary | ICD-10-CM | POA: Diagnosis not present

## 2019-03-31 DIAGNOSIS — E1129 Type 2 diabetes mellitus with other diabetic kidney complication: Secondary | ICD-10-CM | POA: Diagnosis not present

## 2019-03-31 DIAGNOSIS — B351 Tinea unguium: Secondary | ICD-10-CM

## 2019-03-31 DIAGNOSIS — Z716 Tobacco abuse counseling: Secondary | ICD-10-CM

## 2019-03-31 DIAGNOSIS — E785 Hyperlipidemia, unspecified: Secondary | ICD-10-CM | POA: Diagnosis not present

## 2019-03-31 DIAGNOSIS — R809 Proteinuria, unspecified: Secondary | ICD-10-CM

## 2019-03-31 DIAGNOSIS — Z6835 Body mass index (BMI) 35.0-35.9, adult: Secondary | ICD-10-CM

## 2019-03-31 MED ORDER — GABAPENTIN 300 MG PO CAPS: 300.0000 mg | ORAL_CAPSULE | Freq: Every day | ORAL | 1 refills | Status: DC

## 2019-03-31 NOTE — Patient Instructions (Signed)

## 2019-03-31 NOTE — Progress Notes (Signed)
Name: Bill Perez   MRN: [id number]    DOB: 06-01-1970   Date:03/31/2019       Progress Note  Subjective  Chief Complaint  Chief Complaint  Patient presents with  . Diabetes    follow up  . Hyperlipidemia  . Hypertension    HPI  Hyperlipidemia: He has beentaking his Crestor daily at 38m.Hedenies chest pain, shortness of breath, BLE edema, or myalgias. Last LDL was nearing goal - we will recheck today - may increase if needed.   Diabetes: He has been diet controlled, has not had A1C since October 2018.  We will recheck today. Denies polydipsia, polyphagia, or polyuria. Heistaking 2.545mLisinopril for kidney protection.Taking 8172mSA - recalculated need for aspirin, guidelines recommend continuing. Diet has been so-so - not following a particularly strict diabetic diet; doesn't eat many sweets, does enjoy chips sometimes. Due for labs today; urine micro is not UTD. Not checking BG's.  He is reminded to get eye exam ASAP. - Neuropathy: Does endorse bilateral foot pain - the pain is along the toes and pad of foot bilaterally and says it feels like they are a bit "dry and tight".  He tried 100m59mbapentin at night and it did help some, but states it waned in efficacy.  We will try increasing to 300mg45m - will call back if wanting to try 100mg 82minstead.  LEFT Neck and shoulder Pain: intermittent, pain occasionally radiates into the left arm, and neck pain has essentially resolved at this time.Pain is exacerbated by certain movements and putting pressure on the areaand feels good for a few days, then flares. He used to play [employer] old injuries.Flexeril PRN works well for him when he does have a flare.   Tobacco Use: Not ready to quit, down to 1/2ppd. States stress levels have been much better.  Daughter is currently in labor with his newest grandchild and he is excited for this addition.   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 Weight is up 4lbs today.  More active in the summers.  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  Over the last few months he has had slowly progressing pain to the toenail.  No exudate, no erythema or edema.   Patient Active Problem List   Diagnosis Date Noted  . Tobacco abuse counseling 10/29/2017  . Class 2 severe obesity due to excess calories with serious comorbidity and body mass index (BMI) of 35.0 to 35.9 in adult (HCC) 0[place]2/2019  . Muscle strain of thigh 03/14/2017  . Dyslipidemia 06/27/2016  . Abdominal wall strain 03/15/2016  . Type 2 diabetes mellitus with microalbuminuria, without long-term current use of insulin (HCC) 0[place]8/2016    History reviewed. No pertinent surgical history.  Family History  Problem Relation Age of Onset  . Hypertension Mother   . Hyperlipidemia Mother   . Stroke Father   . Diabetes Father     Social History   Socioeconomic History  . Marital status: Married    Spouse name: Not on file  . Number of children: 4  . Years of education: Not on file  . Highest education level: Not on file  Occupational History  . Not on file  Tobacco Use  . Smoking  status: Current Every Day Smoker    Packs/day: 0.50    Years: 25.00    Pack years: 12.50    Types: Cigarettes  . Smokeless tobacco: Never Used  Substance and Sexual Activity  . Alcohol use: Yes    Alcohol/week: 4.0 standard drinks    Types: 4 Glasses of wine per week  . Drug use: Yes  . Sexual activity: Yes    Partners: Female  Other Topics Concern  . Not on file  Social History Narrative  . Not on file   Social Determinants of Health   Financial Resource Strain: Low Risk   . Difficulty of Paying Living Expenses: Not hard at all  Food Insecurity: No Food Insecurity  . Worried About Paediatric nurse in the Last Year: Never true  . Ran Out of Food in the Last Year: Never true  Transportation Needs: Unknown  . Lack of Transportation (Medical): Not on file  . Lack of Transportation (Non-Medical): No  Physical Activity: Insufficiently Active  . Days of Exercise per Week: 2 days  . Minutes of Exercise per Session: 30 min  Stress: No Stress Concern Present  . Feeling of Stress : Only a little  Social Connections: Not Isolated  . Frequency of Communication with Friends and Family: More than three times a week  . Frequency of Social Gatherings with Friends and Family: More than three times a week  . Attends Religious Services: 1 to 4 times per year  . Active Member of Clubs or Organizations: Yes  . Attends Archivist Meetings: More than 4 times per year  . Marital Status: Married  Human resources officer Violence: Not At Risk  . Fear of Current or Ex-Partner: No  . Emotionally Abused: No  . Physically Abused: No  . Sexually Abused: No     Current Outpatient Medications:  .  aspirin 81 MG tablet, Take 81 mg by mouth daily., Disp: , Rfl:  .  Blood Glucose Monitoring Suppl (ONE TOUCH ULTRA MINI) w/Device KIT, See admin instructions., Disp: , Rfl: 0 .  cyclobenzaprine (FLEXERIL) 10 MG tablet, Take 0.5-1 tablets (5-10 mg total) by mouth 3 (three) times daily as needed for muscle spasms., Disp: 30 tablet, Rfl: 0 .  lisinopril (ZESTRIL) 2.5 MG tablet, TAKE 1 TABLET(2.5 MG) BY MOUTH DAILY, Disp: 90 tablet, Rfl: 2 .  ONE TOUCH ULTRA TEST test strip, , Disp: , Rfl: 0 .  ONETOUCH DELICA LANCETS 28B MISC, , Disp: , Rfl: 0 .  rosuvastatin (CRESTOR) 5 MG tablet, Take 1 tablet (5 mg total) by mouth daily., Disp: 90 tablet, Rfl: 3 .  gabapentin (NEURONTIN) 300 MG capsule, Take 1 capsule (300 mg total) by mouth at bedtime., Disp: 90 capsule, Rfl: 1  No Known Allergies  I personally reviewed active problem list, medication list, allergies, notes from last encounter, lab results with  the patient/caregiver today.   ROS Constitutional: Negative for fever or weight change.  Respiratory: Negative for cough and shortness of breath.   Cardiovascular: Negative for chest pain or palpitations.  Gastrointestinal: Negative for abdominal pain, no bowel changes.  Musculoskeletal: Negative for gait problem or joint swelling.  Skin: Negative for rash. See HPI Neurological: Negative for dizziness or headache.  No other specific complaints in a complete review of systems (except as listed in HPI above).  Objective  Vitals:   03/31/19 0856  BP: 120/80  Pulse: 100  Resp: 18  Temp: 97.6 F (36.4 C)  TempSrc: Temporal  SpO2:  98%  Weight: 258 lb 9.6 oz (117.3 kg)  Height: _0  (1.778 m)   Body mass index is 37.11 kg/m.  Physical Exam  Constitutional: Patient appears well-developed and well-nourished. No distress.  HENT: Head: Normocephalic and atraumatic.  Eyes: Conjunctivae and EOM are normal. No scleral icterus.   Neck: Normal range of motion. Neck supple. No JVD present.  Cardiovascular: Normal rate, regular rhythm and normal heart sounds.  No murmur heard. No BLE edema. Pulmonary/Chest: Effort normal and breath sounds normal. No respiratory distress. Musculoskeletal: Normal range of motion, no joint effusions. No gross deformities Neurological: Pt is alert and oriented to person, place, and time. No cranial nerve deficit. Coordination, balance, strength, speech and gait are normal.  Skin: Skin is warm and dry. No rash noted. No erythema. See Foot exam Psychiatric: Patient has a normal mood and affect. behavior is normal. Judgment and thought content normal.   No results found for this or any previous visit (from the past 72 hour(s)).  Diabetic Foot Exam: Diabetic Foot Exam - Simple   Simple Foot Form Diabetic Foot exam was performed with the following findings: Yes 03/31/2019  9:27 AM  Visual Inspection See comments: Yes Sensation Testing Intact to touch and  monofilament testing bilaterally: Yes Pulse Check Posterior Tibialis and Dorsalis pulse intact bilaterally: Yes Comments RIGHT 2nd toenail has thickened, hyperpigmented toenail that is slightly tender to the touch; there is no erythema or edema.      PHQ2/9: Depression screen [facility] 2/9 03/31/2019 09/20/2018 04/04/2018 10/29/2017 09/13/2016  Decreased Interest 0 0 0 0 0  Down, Depressed, Hopeless 0 0 0 0 0  PHQ - 2 Score 0 0 0 0 0  Altered sleeping 0 0 0 0 -  Tired, decreased energy 0 0 0 0 -  Change in appetite 0 0 0 0 -  Feeling bad or failure about yourself  0 0 0 0 -  Trouble concentrating 0 0 0 0 -  Moving slowly or fidgety/restless 0 0 0 0 -  Suicidal thoughts 0 0 0 0 -  PHQ-9 Score 0 0 0 0 -  Difficult doing work/chores Not difficult at all - Not difficult at all - -   PHQ-2/9 Result is negative.    Fall Risk: Fall Risk  03/31/2019 09/20/2018 04/04/2018 10/29/2017 09/13/2016  Falls in the past year? 0 0 0 No No  Number falls in past yr: 0 0 - - -  Injury with Fall? 0 0 - - -  Follow up Falls evaluation completed - - - -   Assessment & Plan  1. Dyslipidemia - Lipid panel - Crestor 19m  2. Type 2 diabetes mellitus with microalbuminuria, without long-term current use of insulin (HCC) - Diet controlled - Hemoglobin A1c - COMPLETE METABOLIC PANEL WITH GFR - Microalbumin / creatinine urine ratio  3. Neck pain on left side - Resolved; keeping muscle relaxer on hand in case there is exacerbation.  4. Class 2 severe obesity due to excess calories with serious comorbidity and body mass index (BMI) of 35.0 to 35.9 in adult ([facility] - Discussed importance of 150 minutes of physical activity weekly, eat two servings of fish weekly, eat one serving of tree nuts ( cashews, pistachios, pecans, almonds..Marland Kitchen every other day, eat 6 servings of fruit/vegetables daily and drink plenty of water and avoid sweet beverages.  - COMPLETE METABOLIC PANEL WITH GFR  5. Tobacco abuse counseling - Not  reading to quit; cessation discussed  6. Diabetic polyneuropathy associated with type  2 diabetes mellitus (HCC) - gabapentin (NEURONTIN) 300 MG capsule; Take 1 capsule (300 mg total) by mouth at bedtime.  Dispense: 90 capsule; Refill: 1  7. Onychomycosis of toenail - Ambulatory referral to Podiatry

## 2019-04-01 LAB — COMPLETE METABOLIC PANEL WITH GFR
AG Ratio: 1.3 (calc) (ref 1.0–2.5)
ALT: 29 U/L (ref 9–46)
AST: 18 U/L (ref 10–40)
Albumin: 4.3 g/dL (ref 3.6–5.1)
Alkaline phosphatase (APISO): 99 U/L (ref 36–130)
BUN: 14 mg/dL (ref 7–25)
CO2: 27 mmol/L (ref 20–32)
Calcium: 9.6 mg/dL (ref 8.6–10.3)
Chloride: 103 mmol/L (ref 98–110)
Creat: 1.24 mg/dL (ref 0.60–1.35)
GFR, Est African American: 79 mL/min/{1.73_m2} (ref >=60)
GFR, Est Non African American: 68 mL/min/{1.73_m2} (ref >=60)
Globulin: 3.3 g/dL (calc) (ref 1.9–3.7)
Glucose, Bld: 123 mg/dL — ABNORMAL HIGH (ref 65–99)
Potassium: 4.8 mmol/L (ref 3.5–5.3)
Sodium: 139 mmol/L (ref 135–146)
Total Bilirubin: 1 mg/dL (ref 0.2–1.2)
Total Protein: 7.6 g/dL (ref 6.1–8.1)

## 2019-04-01 LAB — LIPID PANEL
Cholesterol: 140 mg/dL (ref <200)
HDL: 25 mg/dL — ABNORMAL LOW (ref >=40)
LDL Cholesterol (Calc): 88 mg/dL (calc)
Non-HDL Cholesterol (Calc): 115 mg/dL (calc) (ref <130)
Total CHOL/HDL Ratio: 5.6 (calc) — ABNORMAL HIGH (ref <5.0)
Triglycerides: 168 mg/dL — ABNORMAL HIGH (ref <150)

## 2019-04-01 LAB — HEMOGLOBIN A1C
Hgb A1c MFr Bld: 5.8 % of total Hgb — ABNORMAL HIGH (ref <5.7)
Mean Plasma Glucose: 120 (calc)
eAG (mmol/L): 6.6 (calc)

## 2019-04-01 LAB — MICROALBUMIN / CREATININE URINE RATIO
Creatinine, Urine: 271 mg/dL (ref 20–320)
Microalb Creat Ratio: 95 mcg/mg creat — ABNORMAL HIGH (ref <30)
Microalb, Ur: 25.7 mg/dL

## 2019-04-07 ENCOUNTER — Ambulatory Visit (INDEPENDENT_AMBULATORY_CARE_PROVIDER_SITE_OTHER): Payer: No Typology Code available for payment source | Admitting: Podiatry

## 2019-04-07 ENCOUNTER — Other Ambulatory Visit: Payer: Self-pay

## 2019-04-07 ENCOUNTER — Encounter: Payer: Self-pay | Admitting: Podiatry

## 2019-04-07 VITALS — BP 131/78 | HR 96 | Resp 16

## 2019-04-07 DIAGNOSIS — M79676 Pain in unspecified toe(s): Secondary | ICD-10-CM | POA: Diagnosis not present

## 2019-04-07 DIAGNOSIS — B351 Tinea unguium: Secondary | ICD-10-CM

## 2019-04-07 DIAGNOSIS — L603 Nail dystrophy: Secondary | ICD-10-CM | POA: Diagnosis not present

## 2019-04-07 NOTE — Progress Notes (Signed)
  Subjective:  Patient ID: Bill Perez, male    DOB: [birth year],  MRN: [id number] HPI Chief Complaint  Patient presents with  . Nail Problem    2nd toenail right - thick and discolored x years, injury years ago, can't cut and getting sore  . New Patient (Initial Visit)    48 y.o. male presents with the above complaint.   ROS: Denies fever chills nausea vomiting muscle aches pains calf pain back pain chest pain shortness of breath.  Past Medical History:  Diagnosis Date  . Hyperlipidemia    No past surgical history on file.  Current Outpatient Medications:  .  aspirin 81 MG tablet, Take 81 mg by mouth daily., Disp: , Rfl:  .  Blood Glucose Monitoring Suppl (ONE TOUCH ULTRA MINI) w/Device KIT, See admin instructions., Disp: , Rfl: 0 .  cyclobenzaprine (FLEXERIL) 10 MG tablet, Take 0.5-1 tablets (5-10 mg total) by mouth 3 (three) times daily as needed for muscle spasms., Disp: 30 tablet, Rfl: 0 .  gabapentin (NEURONTIN) 300 MG capsule, Take 1 capsule (300 mg total) by mouth at bedtime., Disp: 90 capsule, Rfl: 1 .  lisinopril (ZESTRIL) 2.5 MG tablet, TAKE 1 TABLET(2.5 MG) BY MOUTH DAILY, Disp: 90 tablet, Rfl: 2 .  ONE TOUCH ULTRA TEST test strip, , Disp: , Rfl: 0 .  ONETOUCH DELICA LANCETS 28B MISC, , Disp: , Rfl: 0 .  rosuvastatin (CRESTOR) 5 MG tablet, Take 1 tablet (5 mg total) by mouth daily., Disp: 90 tablet, Rfl: 3  No Known Allergies Review of Systems Objective:   Vitals:   04/07/19 1020  BP: 131/78  Pulse: 96  Resp: 16    General: Well developed, nourished, in no acute distress, alert and oriented x3   Dermatological: Skin is warm, dry and supple bilateral. Nails x 10 are well maintained; remaining integument appears unremarkable at this time. There are no open sores, no preulcerative lesions, no rash or signs of infection present.  Painful elongated darkened thickened toenail second toe right foot.  No signs of infection.  Vascular: Dorsalis Pedis artery and  Posterior Tibial artery pedal pulses are 2/4 bilateral with immedate capillary fill time. Pedal hair growth present. No varicosities and no lower extremity edema present bilateral.   Neruologic: Grossly intact via light touch bilateral. Vibratory intact via tuning fork bilateral. Protective threshold with Semmes Wienstein monofilament intact to all pedal sites bilateral. Patellar and Achilles deep tendon reflexes 2+ bilateral. No Babinski or clonus noted bilateral.   Musculoskeletal: No gross boney pedal deformities bilateral. No pain, crepitus, or limitation noted with foot and ankle range of motion bilateral. Muscular strength 5/5 in all groups tested bilateral.  Gait: Unassisted, Nonantalgic.    Radiographs:  None taken  Assessment & Plan:   Assessment: Nail dystrophy second digit right foot  Plan: Debridement of dystrophic painful nail second digit right foot.     Thorsten Climer T. [place], [place]

## 2019-05-06 ENCOUNTER — Ambulatory Visit: Payer: No Typology Code available for payment source | Attending: Internal Medicine

## 2019-05-06 DIAGNOSIS — Z20822 Contact with and (suspected) exposure to covid-19: Secondary | ICD-10-CM

## 2019-05-07 LAB — NOVEL CORONAVIRUS, NAA: SARS-CoV-2, NAA: DETECTED — AB

## 2019-05-07 NOTE — Progress Notes (Signed)
Your test for COVID-19 was positive ("detected"), meaning that you were infected with the novel coronavirus and could give the germ to others.    Please continue isolation at home, for at least 10 days since the start of your fever/cough/breathlessness and until you have had 24 hours without fever (without taking a fever reducer) and with any cough/breathlessness improving. Use over-the-counter medications for symptoms.  If you have had no symptoms, but were exposed to someone who was positive for COVID-19, you will need to quarantine and self-isolate for 14 days from the date of exposure.    Please continue good preventive care measures, including:  frequent hand-washing, avoid touching your face, cover coughs/sneezes, stay out of crowds and keep a 6 foot distance from others.  Clean hard surfaces touched frequently with disinfectant cleaning products.   Please check in with your primary care provider about your positive test result.  Go to the nearest urgent care or ED for assessment if you have severe breathlessness or severe weakness/fatigue (ex needing new help getting out of bed or to the bathroom).  Members of your household will also need to quarantine for 14 days from the date of your positive test.You may also be contacted by the health department for follow up. Please call [place] at [phone] if you have any questions or concerns.     

## 2019-06-09 ENCOUNTER — Encounter: Payer: Self-pay | Admitting: Podiatry

## 2019-06-09 ENCOUNTER — Other Ambulatory Visit: Payer: Self-pay

## 2019-06-09 ENCOUNTER — Ambulatory Visit (INDEPENDENT_AMBULATORY_CARE_PROVIDER_SITE_OTHER): Payer: No Typology Code available for payment source | Admitting: Podiatry

## 2019-06-09 DIAGNOSIS — E119 Type 2 diabetes mellitus without complications: Secondary | ICD-10-CM | POA: Diagnosis not present

## 2019-06-09 DIAGNOSIS — M79676 Pain in unspecified toe(s): Secondary | ICD-10-CM

## 2019-06-09 DIAGNOSIS — B351 Tinea unguium: Secondary | ICD-10-CM

## 2019-06-09 NOTE — Progress Notes (Addendum)
Complaint:  Visit Type: Patient returns to my office for continued preventative foot care services. Complaint: Patient states" my nails have grown long and thick and become painful to walk and wear shoes" Patient has been diagnosed with DM with no foot complications. Patient is taking gabapentin.The patient presents for preventative foot care services.  Podiatric Exam: Vascular: dorsalis pedis and posterior tibial pulses are palpable bilateral. Capillary return is immediate. Temperature gradient is WNL. Skin turgor WNL  Sensorium: Normal Semmes Weinstein monofilament test. Normal tactile sensation bilaterally. Nail Exam: Pt has thick disfigured discolored nails with subungual debris noted   1 B/L and 2 right. Ulcer Exam: There is no evidence of ulcer or pre-ulcerative changes or infection. Orthopedic Exam: Muscle tone and strength are WNL. No limitations in general ROM. No crepitus or effusions noted. Foot type and digits show no abnormalities. Mild  HAV  B/L.  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 No infection or ulcers  Diagnosis:  Onychomycosis, , Pain in right toe, pain in left toes  Treatment & Plan Procedures and Treatment: Consent by patient was obtained for treatment procedures.   Debridement of mycotic and hypertrophic toenails, 1 through 5 bilateral and clearing of subungual debris. No ulceration, no infection noted.  Return Visit-Office Procedure: Patient instructed to return to the office for a follow up visit 4 months for continued evaluation and treatment.    Helane Gunther DPM

## 2019-10-09 ENCOUNTER — Ambulatory Visit: Payer: No Typology Code available for payment source | Admitting: Podiatry

## 2020-05-10 ENCOUNTER — Other Ambulatory Visit: Payer: Self-pay

## 2020-05-10 ENCOUNTER — Encounter: Payer: Self-pay | Admitting: Podiatry

## 2020-05-10 ENCOUNTER — Ambulatory Visit (INDEPENDENT_AMBULATORY_CARE_PROVIDER_SITE_OTHER): Payer: No Typology Code available for payment source | Admitting: Podiatry

## 2020-05-10 DIAGNOSIS — E119 Type 2 diabetes mellitus without complications: Secondary | ICD-10-CM | POA: Diagnosis not present

## 2020-05-10 DIAGNOSIS — B351 Tinea unguium: Secondary | ICD-10-CM | POA: Diagnosis not present

## 2020-05-10 DIAGNOSIS — L603 Nail dystrophy: Secondary | ICD-10-CM | POA: Diagnosis not present

## 2020-05-10 DIAGNOSIS — M79676 Pain in unspecified toe(s): Secondary | ICD-10-CM | POA: Diagnosis not present

## 2020-05-10 NOTE — Progress Notes (Signed)
Complaint:  Visit Type: Patient returns to my office for continued preventative foot care services. Complaint: Patient states" my second toenail right foot  haas grown long and thick and become painful to walk and wear shoes" Patient has been diagnosed with DM with no foot complications. Patient is taking gabapentin.The patient presents for preventative foot care services.  Podiatric Exam: Vascular: dorsalis pedis and posterior tibial pulses are palpable bilateral. Capillary return is immediate. Temperature gradient is WNL. Skin turgor WNL  Sensorium: Normal Semmes Weinstein monofilament test. Normal tactile sensation bilaterally. Nail Exam: Pt has thick disfigured discolored nail second toe right foot.  No drainage noted. Ulcer Exam: There is no evidence of ulcer or pre-ulcerative changes or infection. Orthopedic Exam: Muscle tone and strength are WNL. No limitations in general ROM. No crepitus or effusions noted. Foot type and digits show no abnormalities. Mild  HAV  B/L.  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 No infection or ulcers  Diagnosis:  Onychomycosis second toe right foot.  Treatment & Plan Procedures and Treatment: Consent by patient was obtained for treatment procedures.   Debridement of mycotic and hypertrophic toenails second toenail right foot.  and clearing of subungual debris. No ulceration, no infection noted.  Return Visit-Office Procedure: Patient instructed to return to the office for a follow up prn.    Helane Gunther DPM

## 2020-05-11 ENCOUNTER — Telehealth: Payer: Self-pay

## 2020-05-11 NOTE — Telephone Encounter (Signed)
appt made

## 2020-05-11 NOTE — Telephone Encounter (Signed)
Pt need appt for refill on chol med last seen in 2020

## 2020-05-17 ENCOUNTER — Ambulatory Visit: Payer: No Typology Code available for payment source | Admitting: Podiatry

## 2020-05-18 ENCOUNTER — Other Ambulatory Visit: Payer: Self-pay

## 2020-05-18 ENCOUNTER — Encounter: Payer: Self-pay | Admitting: Family Medicine

## 2020-05-18 ENCOUNTER — Ambulatory Visit: Payer: No Typology Code available for payment source | Admitting: Family Medicine

## 2020-05-18 VITALS — BP 128/78 | HR 97 | Temp 98.1°F | Resp 16 | Ht 70.0 in | Wt 249.3 lb

## 2020-05-18 DIAGNOSIS — Z5181 Encounter for therapeutic drug level monitoring: Secondary | ICD-10-CM

## 2020-05-18 DIAGNOSIS — E785 Hyperlipidemia, unspecified: Secondary | ICD-10-CM

## 2020-05-18 DIAGNOSIS — E1129 Type 2 diabetes mellitus with other diabetic kidney complication: Secondary | ICD-10-CM

## 2020-05-18 DIAGNOSIS — E1142 Type 2 diabetes mellitus with diabetic polyneuropathy: Secondary | ICD-10-CM | POA: Diagnosis not present

## 2020-05-18 DIAGNOSIS — R809 Proteinuria, unspecified: Secondary | ICD-10-CM

## 2020-05-18 DIAGNOSIS — M546 Pain in thoracic spine: Secondary | ICD-10-CM

## 2020-05-18 DIAGNOSIS — Z6835 Body mass index (BMI) 35.0-35.9, adult: Secondary | ICD-10-CM

## 2020-05-18 MED ORDER — LISINOPRIL 2.5 MG PO TABS: ORAL_TABLET | ORAL | 3 refills | Status: DC

## 2020-05-18 MED ORDER — METAXALONE 800 MG PO TABS: 400.0000 mg | ORAL_TABLET | Freq: Three times a day (TID) | ORAL | 1 refills | Status: DC | PRN

## 2020-05-18 MED ORDER — ROSUVASTATIN CALCIUM 5 MG PO TABS: 5.0000 mg | ORAL_TABLET | Freq: Every day | ORAL | 3 refills | Status: DC

## 2020-05-18 NOTE — Progress Notes (Signed)
Name: Bill Perez   MRN: [id number]    DOB: 02-23-1971   Date:05/18/2020       Progress Note  Chief Complaint  Patient presents with  . Diabetes  . Hyperlipidemia  . Hypertension     Subjective:   Bill Perez is a 50 y.o. male, presents to clinic for routine f/up   DM:   Pt managing DM with only diet/lifestyle Blood sugars checks occasionally - runs low - 115 or around there Denies: Polyuria, polydipsia, vision changes, neuropathy, hypoglycemia Recent pertinent labs: Lab Results  Component Value Date   HGBA1C 5.8 (H) 03/31/2019   HGBA1C 5.6 09/20/2018   HGBA1C 5.1 10/29/2017   Standard of care and health maintenance: Urine Microalbumin:  Due (off lisinopril - ran out) Foot exam:  Done today - and he sees podiatry DM eye exam:  due ACEI/ARB:  Lisinopril - but ran out Statin:  yes  In 05/2016 isolated high A1C (8+) and hyperglycemia - family hx of DM Pt A1C otherwise has been 4.8-5.8 Not on meds for the last 2-3 years, did take metformin previously 2016-2018  Patient does ask for medication refills he has several medicines through the med reconciliation which are not currently on his chart.  There is an old muscle relaxer, Flexeril and through med rec through care everywhere patient also has narcotic pain medicine high-dose ibuprofen and Skelaxin Back pain-  Has had muscle relaxers in the past for recurrent intermittent low back pain  - would like refill on meds, pain intermittent, did xrays in the past - said it was normal  He cannot recall when back pain started but it has been ongoing for more than a few months and the prior PCPs muscle relaxer was from almost 2 years ago Reviewed additional records through care everywhere where he went to the walk-in clinic at [facility] for acute flare of pain He reports that he continues to have low back pain that the muscle relaxers help him sleep he has not done physical therapy he has not had any recent severe  symptoms  Blood pressure monitoring-was prescribed lisinopril 2.5 mg for renal protection he states that his blood pressure has always been well controlled Off medications for an unknown period of time Blood pressure today is fairly well controlled BP Readings from Last 3 Encounters:  05/18/20 128/78  04/07/19 131/78  03/31/19 120/80  He denies any chest pain, shortness of breath, palpitations, lower extremity edema, orthopnea, PND  On crestor 5 -he was given a higher dose in the past but did not tolerate this very well he reports good compliance with Crestor 5 mg and reports that he has been taking it and has not been out of meds Lab Results  Component Value Date   CHOL 140 03/31/2019   HDL 25 (L) 03/31/2019   LDLCALC 88 03/31/2019   TRIG 168 (H) 03/31/2019   CHOLHDL 5.6 (H) 03/31/2019  He denies any myalgias jaundice fatigue, chest pain, claudication type symptoms The 10-year ASCVD risk score Mikey Bussing DC Jr., et al., 2013) is: 17.3%   Values used to calculate the score:     Age: 14 years     Sex: Male     Is Non-Hispanic African American: Yes     Diabetic: Yes     Tobacco smoker: Yes     Systolic Blood Pressure: 086 mmHg     Is BP treated: No     HDL Cholesterol: 25 mg/dL     Total Cholesterol:  140 mg/dL  Left thigh strain pain, low and mid back pain, has limited activity  Obesity-weight has gone down a little bit in the past year Wt Readings from Last 5 Encounters:  05/18/20 249 lb 4.8 oz (113.1 kg)  03/31/19 258 lb 9.6 oz (117.3 kg)  09/20/18 253 lb 12.8 oz (115.1 kg)  04/04/18 254 lb 9.6 oz (115.5 kg)  10/29/17 249 lb 3.2 oz (113 kg)   BMI Readings from Last 5 Encounters:  05/18/20 35.77 kg/m  03/31/19 37.11 kg/m  09/20/18 36.42 kg/m  04/04/18 36.53 kg/m  10/29/17 35.76 kg/m   Current smoker smokes cigarettes daily, no desire to quit denies any respiratory symptoms, cough, wheeze, dyspnea on exertion declines to discuss smoking cessation today     Current  Outpatient Medications:  .  aspirin 81 MG tablet, Take 81 mg by mouth daily., Disp: , Rfl:  .  gabapentin (NEURONTIN) 300 MG capsule, Take 1 capsule (300 mg total) by mouth at bedtime., Disp: 90 capsule, Rfl: 1 .  lisinopril (ZESTRIL) 2.5 MG tablet, TAKE 1 TABLET(2.5 MG) BY MOUTH DAILY, Disp: 90 tablet, Rfl: 2 .  rosuvastatin (CRESTOR) 5 MG tablet, Take 1 tablet (5 mg total) by mouth daily., Disp: 90 tablet, Rfl: 3 .  Blood Glucose Monitoring Suppl (ONE TOUCH ULTRA MINI) w/Device KIT, See admin instructions. (Patient not taking: Reported on 05/18/2020), Disp: , Rfl: 0 .  cyclobenzaprine (FLEXERIL) 10 MG tablet, Take 0.5-1 tablets (5-10 mg total) by mouth 3 (three) times daily as needed for muscle spasms. (Patient not taking: Reported on 05/18/2020), Disp: 30 tablet, Rfl: 0 .  ONE TOUCH ULTRA TEST test strip, , Disp: , Rfl: 0 .  ONETOUCH DELICA LANCETS 30N MISC, , Disp: , Rfl: 0  Patient Active Problem List   Diagnosis Date Noted  . Diabetes mellitus without complication ([place]) 40/76/8088  . Tobacco abuse counseling 10/29/2017  . Class 2 severe obesity due to excess calories with serious comorbidity and body mass index (BMI) of 35.0 to 35.9 in adult ([place]) 10/29/2017  . Muscle strain of thigh 03/14/2017  . Dyslipidemia 06/27/2016  . Abdominal wall strain 03/15/2016  . Type 2 diabetes mellitus with microalbuminuria, without long-term current use of insulin ([place]) 10/06/2014    History reviewed. No pertinent surgical history.  Family History  Problem Relation Age of Onset  . Hypertension Mother   . Hyperlipidemia Mother   . Stroke Father   . Diabetes Father     Social History   Tobacco Use  . Smoking status: Current Every Day Smoker    Packs/day: 0.50    Years: 25.00    Pack years: 12.50    Types: Cigarettes  . Smokeless tobacco: Never Used  Vaping Use  . Vaping Use: Never used  Substance Use Topics  . Alcohol use: Yes    Alcohol/week: 4.0 standard drinks    Types: 4 Glasses of  wine per week  . Drug use: Yes     No Known Allergies  Health Maintenance  Topic Date Due  . PNEUMOCOCCAL POLYSACCHARIDE VACCINE AGE 66-64 HIGH RISK  Never done  . COVID-19 Vaccine (1) Never done  . COLONOSCOPY (Pts 45-30yr Insurance coverage will need to be confirmed)  Never done  . OPHTHALMOLOGY EXAM  07/04/2017  . HEMOGLOBIN A1C  09/29/2019  . INFLUENZA VACCINE  Never done  . FOOT EXAM  05/10/2021  . TETANUS/TDAP  03/01/2027  . Hepatitis C Screening  Completed  . HIV Screening  Completed    Chart  Review Today: I personally reviewed active problem list, medication list, allergies, family history, social history, health maintenance, notes from last encounter, lab results, imaging with the patient/caregiver today.   Review of Systems  Constitutional: Negative.   HENT: Negative.   Eyes: Negative.   Respiratory: Negative.   Cardiovascular: Negative.   Gastrointestinal: Negative.   Endocrine: Negative.   Genitourinary: Negative.   Musculoskeletal: Negative.   Skin: Negative.   Allergic/Immunologic: Negative.   Neurological: Negative.   Hematological: Negative.   Psychiatric/Behavioral: Negative.   All other systems reviewed and are negative.    Objective:   Vitals:   05/18/20 0923  BP: 128/78  Pulse: 97  Resp: 16  Temp: 98.1 F (36.7 C)  TempSrc: Oral  SpO2: 97%  Weight: 249 lb 4.8 oz (113.1 kg)  Height: $Remove'5\' 10"'zBbSwuB$  (1.778 m)    Body mass index is 35.77 kg/m.  Physical Exam Vitals and nursing note reviewed.  Constitutional:      General: He is not in acute distress.    Appearance: Normal appearance. He is well-developed. He is not ill-appearing, toxic-appearing or diaphoretic.     Interventions: Face mask in place.  HENT:     Head: Normocephalic and atraumatic.     Jaw: No trismus.     Right Ear: External ear normal.     Left Ear: External ear normal.  Eyes:     General: Lids are normal. No scleral icterus.       Right eye: No discharge.        Left  eye: No discharge.     Conjunctiva/sclera: Conjunctivae normal.  Neck:     Trachea: Trachea and phonation normal. No tracheal deviation.  Cardiovascular:     Rate and Rhythm: Normal rate and regular rhythm.     Pulses: Normal pulses.          Radial pulses are 2+ on the right side and 2+ on the left side.       Posterior tibial pulses are 2+ on the right side and 2+ on the left side.     Heart sounds: Normal heart sounds. No murmur heard. No friction rub. No gallop.   Pulmonary:     Effort: Pulmonary effort is normal. No respiratory distress.     Breath sounds: Normal breath sounds. No stridor. No wheezing, rhonchi or rales.  Abdominal:     General: Bowel sounds are normal. There is no distension.     Palpations: Abdomen is soft.  Musculoskeletal:     Right lower leg: No edema.     Left lower leg: No edema.  Skin:    General: Skin is warm and dry.     Coloration: Skin is not jaundiced.     Findings: No rash.     Nails: There is no clubbing.  Neurological:     Mental Status: He is alert. Mental status is at baseline.     Cranial Nerves: No dysarthria or facial asymmetry.     Motor: No tremor or abnormal muscle tone.     Gait: Gait normal.  Psychiatric:        Mood and Affect: Mood normal.        Speech: Speech normal.        Behavior: Behavior normal. Behavior is cooperative.     Diabetic Foot Exam - Simple   Simple Foot Form Diabetic Foot exam was performed with the following findings: Yes 05/18/2020  9:30 AM  Visual Inspection Sensation Testing Pulse Check Comments  Assessment & Plan:     ICD-10-CM   1. Type 2 diabetes mellitus with microalbuminuria, without long-term current use of insulin (HCC)  E11.29 CBC with Differential/Platelet   X41.2 COMPLETE METABOLIC PANEL WITH GFR    Lipid panel    Hemoglobin A1c    Microalbumin, urine    rosuvastatin (CRESTOR) 5 MG tablet    lisinopril (ZESTRIL) 2.5 MG tablet    Ambulatory referral to Ophthalmology   Has  been well controlled with diet, overdue for labs, foot exam due, needs eye exam, compliant with statin needs refill on ACE inhibitor  2. Dyslipidemia  I78.6 COMPLETE METABOLIC PANEL WITH GFR    Lipid panel    rosuvastatin (CRESTOR) 5 MG tablet   He reports good compliance to lower dose Crestor due for labs, no side effects myalgias or concerning symptoms today  3. Class 2 severe obesity due to excess calories with serious comorbidity and body mass index (BMI) of 35.0 to 35.9 in adult (HCC)  E66.01    Z68.35    Weight down a little compared to last year encouraged healthy diet lifestyle and increasing activity and exercise  4. Diabetic polyneuropathy associated with type 2 diabetes mellitus (HCC)  E11.42    Neuropathy is currently well controlled  5. Bilateral thoracic back pain, unspecified chronicity  M54.6 metaxalone (SKELAXIN) 800 MG tablet   Onset more than a few months ago possibly 2 years ago with recent urgent care visit, requests muscle relaxer refills -discussed NSAIDs heat therapy PT referral  6. Encounter for medication monitoring  Z51.81 CBC with Differential/Platelet    COMPLETE METABOLIC PANEL WITH GFR    Lipid panel    Hemoglobin A1c    Microalbumin, urine   I explained to the patient the for his back pain it has been going on for months and not improving I was happy to refill his muscle relaxers but it is not going to change the cause of his back pain so he should consider a physical therapy referral and should also do conservative management like gentle stretching, working on core exercises, NSAIDs, Tylenol, heat therapy.  Reviewed red flags and encouraged him to follow-up if anything is worsening For now we can refill his Skelaxin which he preferred over Flexeril -reviewed risks and possible side effects with medication including specifically telling him to take the lowest dose needed, and to not drive or operate machinery while taking medicine and he verbalized  understanding  He does have fungal nail disease which she is seeing podiatry for -no other concerning findings with his diabetic foot exam  We will need to refer him ophthalmology to do his DM eye exam  All vaccinations and screening reviewed and declined at this time but the patient as noted below: Health Maintenance  Topic Date Due  . OPHTHALMOLOGY EXAM  07/04/2017  . HEMOGLOBIN A1C  09/29/2019  . INFLUENZA VACCINE  07/08/2020 (Originally 11/09/2019)  . COLONOSCOPY (Pts 45-20yr Insurance coverage will need to be confirmed)  05/18/2021 (Originally 07/15/2015)  . PNEUMOCOCCAL POLYSACCHARIDE VACCINE AGE 36-64 HIGH RISK  05/18/2021 (Originally [birth year]14/1972)  . COVID-19 Vaccine (3 - Booster for Moderna series) 06/14/2020  . FOOT EXAM  05/18/2021  . TETANUS/TDAP  03/01/2027  . Hepatitis C Screening  Completed  . HIV Screening  Completed     Return in about 6 months (around 11/15/2020).   LDelsa Grana PA-C 05/18/20 9:29 AM

## 2020-05-19 LAB — LIPID PANEL
Cholesterol: 152 mg/dL (ref <200)
HDL: 25 mg/dL — ABNORMAL LOW (ref >=40)
LDL Cholesterol (Calc): 106 mg/dL (calc) — ABNORMAL HIGH
Non-HDL Cholesterol (Calc): 127 mg/dL (calc) (ref <130)
Total CHOL/HDL Ratio: 6.1 (calc) — ABNORMAL HIGH (ref <5.0)
Triglycerides: 118 mg/dL (ref <150)

## 2020-05-19 LAB — CBC WITH DIFFERENTIAL/PLATELET
Absolute Monocytes: 578 cells/uL (ref 200–950)
Basophils Absolute: 51 cells/uL (ref 0–200)
Basophils Relative: 0.6 %
Eosinophils Absolute: 170 cells/uL (ref 15–500)
Eosinophils Relative: 2 %
HCT: 49 % (ref 38.5–50.0)
Hemoglobin: 16.3 g/dL (ref 13.2–17.1)
Lymphs Abs: 2363 cells/uL (ref 850–3900)
MCH: 30.2 pg (ref 27.0–33.0)
MCHC: 33.3 g/dL (ref 32.0–36.0)
MCV: 90.7 fL (ref 80.0–100.0)
MPV: 9.9 fL (ref 7.5–12.5)
Monocytes Relative: 6.8 %
Neutro Abs: 5338 cells/uL (ref 1500–7800)
Neutrophils Relative %: 62.8 %
Platelets: 326 10*3/uL (ref 140–400)
RBC: 5.4 10*6/uL (ref 4.20–5.80)
RDW: 11.7 % (ref 11.0–15.0)
Total Lymphocyte: 27.8 %
WBC: 8.5 10*3/uL (ref 3.8–10.8)

## 2020-05-19 LAB — COMPLETE METABOLIC PANEL WITH GFR
AG Ratio: 1.2 (calc) (ref 1.0–2.5)
ALT: 22 U/L (ref 9–46)
AST: 18 U/L (ref 10–40)
Albumin: 4.1 g/dL (ref 3.6–5.1)
Alkaline phosphatase (APISO): 94 U/L (ref 36–130)
BUN/Creatinine Ratio: 10 (calc) (ref 6–22)
BUN: 14 mg/dL (ref 7–25)
CO2: 27 mmol/L (ref 20–32)
Calcium: 9.1 mg/dL (ref 8.6–10.3)
Chloride: 105 mmol/L (ref 98–110)
Creat: 1.38 mg/dL — ABNORMAL HIGH (ref 0.60–1.35)
GFR, Est African American: 69 mL/min/{1.73_m2} (ref >=60)
GFR, Est Non African American: 60 mL/min/{1.73_m2} (ref >=60)
Globulin: 3.3 g/dL (calc) (ref 1.9–3.7)
Glucose, Bld: 96 mg/dL (ref 65–99)
Potassium: 4.2 mmol/L (ref 3.5–5.3)
Sodium: 139 mmol/L (ref 135–146)
Total Bilirubin: 0.8 mg/dL (ref 0.2–1.2)
Total Protein: 7.4 g/dL (ref 6.1–8.1)

## 2020-05-19 LAB — MICROALBUMIN, URINE: Microalb, Ur: 42.4 mg/dL

## 2020-05-19 LAB — HEMOGLOBIN A1C
Hgb A1c MFr Bld: 5.1 % of total Hgb (ref <5.7)
Mean Plasma Glucose: 100 mg/dL
eAG (mmol/L): 5.5 mmol/L

## 2020-05-19 NOTE — Progress Notes (Signed)
Labs show his cholesterol is up - not as well controlled as it was previously - please have him check that he has and has been taking crestor 5 daily - if he has we may want to see if he can tolerate increasing to 10 mg - or he can work on diet/lifestyle/exercise etc and see if cholesterol gets better. He also had more protein in his urine - restarting the lisinopril helps protect kidney's  So I do want to recheck this in 3-4 months after he's gotten back on lisinopril - check kidney and microalbumin in urine again  His creatinine was up a little (kidney marker) but everything else was good, blood sugar and A1C were awesome - normal range.  No anemia, all other labs like liver and electrolytes were good     Plan - increase crestor dose? Work on diet/lifestyle  Get back on lisinopril   come in for f/up visit in 3-4 months to recheck lipid, chemistry, urine microalbumin

## 2020-11-15 ENCOUNTER — Ambulatory Visit: Payer: No Typology Code available for payment source | Admitting: Family Medicine

## 2020-11-15 ENCOUNTER — Other Ambulatory Visit: Payer: Self-pay

## 2020-11-15 ENCOUNTER — Encounter: Payer: Self-pay | Admitting: Family Medicine

## 2020-11-15 VITALS — BP 120/76 | HR 97 | Temp 98.3°F | Resp 14 | Ht 70.0 in | Wt 245.7 lb

## 2020-11-15 DIAGNOSIS — F17200 Nicotine dependence, unspecified, uncomplicated: Secondary | ICD-10-CM

## 2020-11-15 DIAGNOSIS — Z1211 Encounter for screening for malignant neoplasm of colon: Secondary | ICD-10-CM

## 2020-11-15 DIAGNOSIS — E785 Hyperlipidemia, unspecified: Secondary | ICD-10-CM

## 2020-11-15 DIAGNOSIS — M545 Low back pain, unspecified: Secondary | ICD-10-CM

## 2020-11-15 DIAGNOSIS — Z5181 Encounter for therapeutic drug level monitoring: Secondary | ICD-10-CM

## 2020-11-15 DIAGNOSIS — R7303 Prediabetes: Secondary | ICD-10-CM

## 2020-11-15 DIAGNOSIS — Z6835 Body mass index (BMI) 35.0-35.9, adult: Secondary | ICD-10-CM

## 2020-11-15 DIAGNOSIS — Z716 Tobacco abuse counseling: Secondary | ICD-10-CM

## 2020-11-15 DIAGNOSIS — M79652 Pain in left thigh: Secondary | ICD-10-CM

## 2020-11-15 DIAGNOSIS — M25552 Pain in left hip: Secondary | ICD-10-CM

## 2020-11-15 MED ORDER — TIZANIDINE HCL 4 MG PO TABS: 2.0000 mg | ORAL_TABLET | Freq: Three times a day (TID) | ORAL | 2 refills | Status: DC | PRN

## 2020-11-15 NOTE — Progress Notes (Signed)
Name: Bill DubsRandy E Perez   MRN: [id number]    DOB: 09/13/1970   Date:11/15/2020       Progress Note  Chief Complaint  Patient presents with   Diabetes   Hyperlipidemia     Subjective:   Bill Perez is a 50 y.o. male, presents to clinic for routine f/up  Hypertension:  Currently managed on lisinopril 2.5 mg daily Pt reports good med compliance and denies any SE.   Blood pressure today is well controlled. BP Readings from Last 3 Encounters:  11/15/20 120/76  05/18/20 128/78  04/07/19 131/78   Pt denies CP, SOB, exertional sx, LE edema, palpitation, Ha's, visual disturbances, lightheadedness, hypotension, syncope. Dietary efforts for BP?  Trying to overall maintain healthy diet    Hyperlipidemia: Currently treated with crestor 5 mg - but wasn't getting it from the pharmacy  Last Lipids: Lab Results  Component Value Date   CHOL 152 05/18/2020   HDL 25 (L) 05/18/2020   LDLCALC 106 (H) 05/18/2020   TRIG 118 05/18/2020   CHOLHDL 6.1 (H) 05/18/2020   - Denies: Chest pain, shortness of breath, myalgias, claudication  Hx of DM and prediabetes: In 05/2016 isolated high A1C (8+) and hyperglycemia - family hx of DM Pt A1C otherwise has been 4.8-5.8 Not on meds for the last 2-3 years, did take metformin previously 2016-2018  The 10-year ASCVD risk score Denman George(Goff DC Jr., et al., 2013) is: 16.7%   Values used to calculate the score:     Age: [age] years     Sex: Male     Is Non-Hispanic African American: Yes     Diabetic: Yes     Tobacco smoker: Yes     Systolic Blood Pressure: 120 mmHg     Is BP treated: No     HDL Cholesterol: 25 mg/dL     Total Cholesterol: 152 mg/dL  Left thigh, hip and back pain, onset years ago, gradually worse, taking OTC meds, requests muscle relaxers, previously did PT/xrays, limping  Current smoker - no cough, SOB, CP   Current Outpatient Medications:    aspirin 81 MG tablet, Take 81 mg by mouth daily., Disp: , Rfl:    lisinopril (ZESTRIL) 2.5  MG tablet, TAKE 1 TABLET(2.5 MG) BY MOUTH DAILY, Disp: 90 tablet, Rfl: 3   rosuvastatin (CRESTOR) 5 MG tablet, Take 1 tablet (5 mg total) by mouth at bedtime., Disp: 90 tablet, Rfl: 3  Patient Active Problem List   Diagnosis Date Noted   Tobacco abuse counseling 10/29/2017   Class 2 severe obesity due to excess calories with serious comorbidity and body mass index (BMI) of 35.0 to 35.9 in adult (HCC) 10/29/2017   Muscle strain of thigh 03/14/2017   Dyslipidemia 06/27/2016   Abdominal wall strain 03/15/2016    History reviewed. No pertinent surgical history.  Family History  Problem Relation Age of Onset   Hypertension Mother    Hyperlipidemia Mother    Stroke Father    Diabetes Father     Social History   Tobacco Use   Smoking status: Every Day    Packs/day: 0.50    Years: 25.00    Pack years: 12.50    Types: Cigarettes   Smokeless tobacco: Never  Vaping Use   Vaping Use: Never used  Substance Use Topics   Alcohol use: Yes    Alcohol/week: 4.0 standard drinks    Types: 4 Glasses of wine per week   Drug use: Yes     No  Known Allergies  Health Maintenance  Topic Date Due   Pneumococcal Vaccine 67-40 Years old (1 - PCV) Never done   OPHTHALMOLOGY EXAM  07/04/2017   HEMOGLOBIN A1C  11/15/2020   COVID-19 Vaccine (3 - Booster for Moderna series) 12/01/2020 (Originally 05/17/2020)   Zoster Vaccines- Shingrix (1 of 2) 02/15/2021 (Originally 07/14/2020)   COLONOSCOPY (Pts 45-91yrs Insurance coverage will need to be confirmed)  05/18/2021 (Originally 07/15/2015)   PNEUMOCOCCAL POLYSACCHARIDE VACCINE AGE 33-64 HIGH RISK  05/18/2021 (Originally 07/14/1972)   INFLUENZA VACCINE  07/08/2021 (Originally 11/08/2020)   FOOT EXAM  05/18/2021   TETANUS/TDAP  03/01/2027   Hepatitis C Screening  Completed   HIV Screening  Completed   HPV VACCINES  Aged Out    Chart Review Today: I personally reviewed active problem list, medication list, allergies, family history, social history, health  maintenance, notes from last encounter, lab results, imaging with the patient/caregiver today.   Review of Systems  Constitutional: Negative.   HENT: Negative.    Eyes: Negative.   Respiratory: Negative.    Cardiovascular: Negative.   Gastrointestinal: Negative.   Endocrine: Negative.   Genitourinary: Negative.   Musculoskeletal: Negative.   Skin: Negative.   Allergic/Immunologic: Negative.   Neurological: Negative.   Hematological: Negative.   Psychiatric/Behavioral: Negative.    All other systems reviewed and are negative.   Objective:   Vitals:   11/15/20 1022  BP: 120/76  Pulse: 97  Resp: 14  Temp: 98.3 F (36.8 C)  SpO2: 97%  Weight: 245 lb 11.2 oz (111.4 kg)  Height: 5\' 10"  (1.778 m)    Body mass index is 35.25 kg/m.  Physical Exam Vitals and nursing note reviewed.  Constitutional:      General: He is not in acute distress.    Appearance: Normal appearance. He is well-developed and well-groomed. He is obese. He is not ill-appearing, toxic-appearing or diaphoretic.     Interventions: Face mask in place.  HENT:     Head: Normocephalic and atraumatic.     Jaw: No trismus.     Right Ear: External ear normal.     Left Ear: External ear normal.  Eyes:     General: Lids are normal. No scleral icterus.       Right eye: No discharge.        Left eye: No discharge.     Conjunctiva/sclera: Conjunctivae normal.  Neck:     Trachea: Trachea and phonation normal. No tracheal deviation.  Cardiovascular:     Rate and Rhythm: Normal rate and regular rhythm.     Pulses: Normal pulses.          Radial pulses are 2+ on the right side and 2+ on the left side.       Posterior tibial pulses are 2+ on the right side and 2+ on the left side.     Heart sounds: Normal heart sounds. No murmur heard.   No friction rub. No gallop.  Pulmonary:     Effort: Pulmonary effort is normal. No respiratory distress.     Breath sounds: Normal breath sounds. No stridor. No wheezing,  rhonchi or rales.  Abdominal:     General: Bowel sounds are normal. There is no distension.     Palpations: Abdomen is soft.  Musculoskeletal:        General: No swelling, deformity or signs of injury.     Thoracic back: Normal. No spasms, tenderness or bony tenderness. Normal range of motion.     Lumbar  back: Normal. No swelling, edema, spasms, tenderness or bony tenderness. Normal range of motion. Negative right straight leg raise test and negative left straight leg raise test.     Left hip: No deformity, tenderness or bony tenderness. Decreased range of motion. Normal strength.     Left upper leg: No swelling, edema or deformity.     Right lower leg: No edema.     Left lower leg: No edema.  Skin:    General: Skin is warm and dry.     Coloration: Skin is not jaundiced.     Findings: No rash.     Nails: There is no clubbing.  Neurological:     Mental Status: He is alert. Mental status is at baseline.     Cranial Nerves: No dysarthria or facial asymmetry.     Sensory: No sensory deficit.     Motor: No tremor or abnormal muscle tone.     Gait: Gait abnormal (antalgic).  Psychiatric:        Mood and Affect: Mood normal.        Speech: Speech normal.        Behavior: Behavior normal. Behavior is cooperative.        Assessment & Plan:     ICD-10-CM   1. Prediabetes  R73.03 Hemoglobin A1C    COMPLETE METABOLIC PANEL WITH GFR    Lipid panel   2018 A1C >8, since then A1C normal or prediabetic range, not on meds for years    2. Dyslipidemia  E78.5 COMPLETE METABOLIC PANEL WITH GFR    Lipid panel   poor compliance with crestor 5 - not sure when he has taken last, but has refill ready at pharmacy - took 10 mg in the past    3. Class 2 severe obesity due to excess calories with serious comorbidity and body mass index (BMI) of 35.0 to 35.9 in adult (HCC)  E66.01 Hemoglobin A1C   Z68.35 COMPLETE METABOLIC PANEL WITH GFR    Lipid panel   encouraged healthy diet and exercise as able     4. Encounter for medication monitoring  Z51.81 Hemoglobin A1C    COMPLETE METABOLIC PANEL WITH GFR    Lipid panel    5. Screening for colon cancer  Z12.11 Cologuard    6. Left thigh pain  M79.652 tiZANidine (ZANAFLEX) 4 MG tablet    Ambulatory referral to Sports Medicine   limp, did PT in the past, left anterior thigh and left hip, states he did not do home exercises and didn't f/up with PT, consult sports med?     7. Left low back pain, unspecified chronicity, unspecified whether sciatica present  M54.50 tiZANidine (ZANAFLEX) 4 MG tablet   some low back and left hip pain - possibly sciatica/DDD, doubt sacroiliitis, prior xrays neg    8. Left hip pain  M25.552    limited ROM with hip flexion, internal and external rotation also exacerbate pain, antalgic gait - gradually worsening, started years ago - states xrays neg    9. Current smoker  F17.200    10 cigs a day, reviewed increased ASCVD risk and showed him lower risk if able to d/c smoking    10. Encounter for smoking cessation counseling  Z71.6      Smoking cessation instruction/counseling given:  counseled patient on the dangers of tobacco use, advised patient to stop smoking, and reviewed strategies to maximize success   Return in about 6 months (around 05/18/2021) for Annual Physical.   Sheliah Mends  Elease Hashimoto 11/15/20 10:54 AM

## 2020-11-16 LAB — COMPLETE METABOLIC PANEL WITH GFR
AG Ratio: 1.4 (calc) (ref 1.0–2.5)
ALT: 16 U/L (ref 9–46)
AST: 15 U/L (ref 10–35)
Albumin: 4.2 g/dL (ref 3.6–5.1)
Alkaline phosphatase (APISO): 86 U/L (ref 35–144)
BUN: 13 mg/dL (ref 7–25)
CO2: 28 mmol/L (ref 20–32)
Calcium: 9.2 mg/dL (ref 8.6–10.3)
Chloride: 106 mmol/L (ref 98–110)
Creat: 1.21 mg/dL (ref 0.70–1.30)
Globulin: 3.1 g/dL (calc) (ref 1.9–3.7)
Glucose, Bld: 82 mg/dL (ref 65–99)
Potassium: 4.7 mmol/L (ref 3.5–5.3)
Sodium: 140 mmol/L (ref 135–146)
Total Bilirubin: 0.8 mg/dL (ref 0.2–1.2)
Total Protein: 7.3 g/dL (ref 6.1–8.1)
eGFR: 73 mL/min/{1.73_m2} (ref >=60)

## 2020-11-16 LAB — LIPID PANEL
Cholesterol: 187 mg/dL (ref <200)
HDL: 25 mg/dL — ABNORMAL LOW (ref >=40)
LDL Cholesterol (Calc): 130 mg/dL (calc) — ABNORMAL HIGH
Non-HDL Cholesterol (Calc): 162 mg/dL (calc) — ABNORMAL HIGH (ref <130)
Total CHOL/HDL Ratio: 7.5 (calc) — ABNORMAL HIGH (ref <5.0)
Triglycerides: 185 mg/dL — ABNORMAL HIGH (ref <150)

## 2020-11-16 LAB — HEMOGLOBIN A1C
Hgb A1c MFr Bld: 4.9 % of total Hgb (ref <5.7)
Mean Plasma Glucose: 94 mg/dL
eAG (mmol/L): 5.2 mmol/L

## 2020-11-26 LAB — COLOGUARD: Cologuard: NEGATIVE

## 2020-12-02 ENCOUNTER — Ambulatory Visit: Payer: No Typology Code available for payment source | Admitting: Family Medicine

## 2020-12-02 ENCOUNTER — Other Ambulatory Visit: Payer: Self-pay

## 2020-12-02 ENCOUNTER — Encounter: Payer: Self-pay | Admitting: Family Medicine

## 2020-12-02 VITALS — BP 124/80 | HR 94 | Temp 98.4°F | Ht 70.0 in | Wt 245.0 lb

## 2020-12-02 DIAGNOSIS — G5712 Meralgia paresthetica, left lower limb: Secondary | ICD-10-CM | POA: Diagnosis not present

## 2020-12-02 MED ORDER — GABAPENTIN 100 MG PO CAPS: ORAL_CAPSULE | ORAL | 11 refills | Status: DC

## 2020-12-02 MED ORDER — MELOXICAM 15 MG PO TABS: 15.0000 mg | ORAL_TABLET | Freq: Every day | ORAL | 0 refills | Status: DC

## 2020-12-02 NOTE — Assessment & Plan Note (Signed)
Patient presents with roughly 2-3 years of progressively worsening left anterolateral thigh pain and symptoms described as numbness.  He denies any specific trauma or relative overuse, has noted associated antalgic gait on the left, no bowel/bladder dysfunction, no discrete low back pain.  Numbness to the primarily anterior thigh, has progressed at times to the lateral left thigh.  Denies any symptoms distal to the knee.  He denies any overt weakness.  Examination today reveals significant tenderness and positive Tinel's at the left inguinal ligament laterally.  Negative straight leg raise, he does have weakness with resisted left hip flexion, otherwise motor function intact.  Diminished sensation at the anterolateral left thigh, contralateral benign.  Patient's clinical history and findings raise concern for meralgia paresthetica, differential to include lumbar intervertebral disc pathology given the noted weakness.  Risk factors include patient's body habitus, tight belt.  I reviewed the same with the patient as well as treatment strategies.  I discussed modifying garments across the waistline to avoid excessive pressure, initiation of scheduled meloxicam, gabapentin for neuropathic symptoms, and home-based rehab.  He is to return for follow-up in 4 weeks, if suboptimal progress, can consider escalation of pharmacotherapy, formal physical therapy, ultrasound-guided injection of the lateral femoral cutaneous nerve.

## 2020-12-02 NOTE — Progress Notes (Signed)
Primary Care / Sports Medicine Office Visit  Patient Information:  Patient ID: Bill Perez, male DOB: April 29, 1970 Age: 50 y.o. MRN: [id number]   Bill Perez is a pleasant 50 y.o. male presenting with the following:  Chief Complaint  Patient presents with   New Patient (Initial Visit)   Leg Pain    Left thigh; x2 year; has done PT with some relief about 2 years ago; some tingling in feet; history of prediabetes; taking tizanidine as needed, patient unsure if effective because it causes him to sleep; worse when walking; no known injury; no recent imaging; 7/10 pain    Review of Systems pertinent details above   Patient Active Problem List   Diagnosis Date Noted   Meralgia paresthetica of left side 12/02/2020   Tobacco abuse counseling 10/29/2017   Class 2 severe obesity due to excess calories with serious comorbidity and body mass index (BMI) of 35.0 to 35.9 in adult (HCC) 10/29/2017   Muscle strain of thigh 03/14/2017   Dyslipidemia 06/27/2016   Abdominal wall strain 03/15/2016   Past Medical History:  Diagnosis Date   Hyperlipidemia    Hypertension    Outpatient Encounter Medications as of 12/02/2020  Medication Sig   aspirin 81 MG tablet Take 81 mg by mouth daily.   gabapentin (NEURONTIN) 100 MG capsule One tab PO qHS for a week, then BID for a week, then TID.   lisinopril (ZESTRIL) 2.5 MG tablet TAKE 1 TABLET(2.5 MG) BY MOUTH DAILY   meloxicam (MOBIC) 15 MG tablet Take 1 tablet (15 mg total) by mouth daily.   rosuvastatin (CRESTOR) 5 MG tablet Take 1 tablet (5 mg total) by mouth at bedtime.   tiZANidine (ZANAFLEX) 4 MG tablet Take 0.5-1.5 tablets (2-6 mg total) by mouth every 8 (eight) hours as needed for muscle spasms (muscle tightness).   No facility-administered encounter medications on file as of 12/02/2020.   History reviewed. No pertinent surgical history.  Vitals:   12/02/20 1559  BP: 124/80  Pulse: 94  Temp: 98.4 F (36.9 C)  SpO2: 97%   Vitals:    12/02/20 1559  Weight: 245 lb (111.1 kg)  Height: 5\' 10"  (1.778 m)   Body mass index is 35.15 kg/m.  No results found.   Independent interpretation of notes and tests performed by another provider:   None  Procedures performed:   None  Pertinent History, Exam, Impression, and Recommendations:   Meralgia paresthetica of left side Patient presents with roughly 2-3 years of progressively worsening left anterolateral thigh pain and symptoms described as numbness.  He denies any specific trauma or relative overuse, has noted associated antalgic gait on the left, no bowel/bladder dysfunction, no discrete low back pain.  Numbness to the primarily anterior thigh, has progressed at times to the lateral left thigh.  Denies any symptoms distal to the knee.  He denies any overt weakness.  Examination today reveals significant tenderness and positive Tinel's at the left inguinal ligament laterally.  Negative straight leg raise, he does have weakness with resisted left hip flexion, otherwise motor function intact.  Diminished sensation at the anterolateral left thigh, contralateral benign.  Patient's clinical history and findings raise concern for meralgia paresthetica, differential to include lumbar intervertebral disc pathology given the noted weakness.  Risk factors include patient's body habitus, tight belt.  I reviewed the same with the patient as well as treatment strategies.  I discussed modifying garments across the waistline to avoid excessive pressure, initiation of scheduled meloxicam, gabapentin  for neuropathic symptoms, and home-based rehab.  He is to return for follow-up in 4 weeks, if suboptimal progress, can consider escalation of pharmacotherapy, formal physical therapy, ultrasound-guided injection of the lateral femoral cutaneous nerve.   Orders & Medications Meds ordered this encounter  Medications   meloxicam (MOBIC) 15 MG tablet    Sig: Take 1 tablet (15 mg total) by mouth  daily.    Dispense:  30 tablet    Refill:  0   gabapentin (NEURONTIN) 100 MG capsule    Sig: One tab PO qHS for a week, then BID for a week, then TID.    Dispense:  90 capsule    Refill:  11   No orders of the defined types were placed in this encounter.    Return in about 4 weeks (around 12/30/2020).     Jerrol Banana, MD   Primary Care Sports Medicine [facility]

## 2020-12-02 NOTE — Patient Instructions (Signed)
-   Start daily meloxicam (take with food), take consistently until follow-up - Dose nightly gabapentin, can increase weekly to lowest dose necessary to adequately control symptoms - Modify belt/avoid tight clothing around the waistline - Start exercises with information provided - Return for follow-up in 4 weeks, contact for questions between now and then

## 2020-12-24 ENCOUNTER — Emergency Department: Payer: No Typology Code available for payment source

## 2020-12-24 ENCOUNTER — Inpatient Hospital Stay
Admission: EM | Admit: 2020-12-24 | Discharge: 2020-12-28 | DRG: 059 | Disposition: A | Discharge status: Home / Self Care | Payer: No Typology Code available for payment source | Attending: Internal Medicine | Admitting: Internal Medicine

## 2020-12-24 ENCOUNTER — Other Ambulatory Visit: Payer: Self-pay | Admitting: Ophthalmology

## 2020-12-24 ENCOUNTER — Other Ambulatory Visit: Payer: Self-pay

## 2020-12-24 DIAGNOSIS — M4712 Other spondylosis with myelopathy, cervical region: Secondary | ICD-10-CM | POA: Diagnosis present

## 2020-12-24 DIAGNOSIS — Z823 Family history of stroke: Secondary | ICD-10-CM | POA: Diagnosis not present

## 2020-12-24 DIAGNOSIS — Z791 Long term (current) use of non-steroidal anti-inflammatories (NSAID): Secondary | ICD-10-CM

## 2020-12-24 DIAGNOSIS — T380X5A Adverse effect of glucocorticoids and synthetic analogues, initial encounter: Secondary | ICD-10-CM | POA: Diagnosis not present

## 2020-12-24 DIAGNOSIS — M5104 Intervertebral disc disorders with myelopathy, thoracic region: Secondary | ICD-10-CM | POA: Diagnosis present

## 2020-12-24 DIAGNOSIS — Z83438 Family history of other disorder of lipoprotein metabolism and other lipidemia: Secondary | ICD-10-CM | POA: Diagnosis not present

## 2020-12-24 DIAGNOSIS — M792 Neuralgia and neuritis, unspecified: Secondary | ICD-10-CM | POA: Diagnosis present

## 2020-12-24 DIAGNOSIS — E785 Hyperlipidemia, unspecified: Secondary | ICD-10-CM | POA: Diagnosis present

## 2020-12-24 DIAGNOSIS — E875 Hyperkalemia: Secondary | ICD-10-CM | POA: Diagnosis present

## 2020-12-24 DIAGNOSIS — Z8249 Family history of ischemic heart disease and other diseases of the circulatory system: Secondary | ICD-10-CM | POA: Diagnosis not present

## 2020-12-24 DIAGNOSIS — I10 Essential (primary) hypertension: Secondary | ICD-10-CM | POA: Diagnosis present

## 2020-12-24 DIAGNOSIS — Z20822 Contact with and (suspected) exposure to covid-19: Secondary | ICD-10-CM | POA: Diagnosis present

## 2020-12-24 DIAGNOSIS — H5461 Unqualified visual loss, right eye, normal vision left eye: Secondary | ICD-10-CM | POA: Diagnosis present

## 2020-12-24 DIAGNOSIS — E559 Vitamin D deficiency, unspecified: Secondary | ICD-10-CM | POA: Diagnosis present

## 2020-12-24 DIAGNOSIS — R739 Hyperglycemia, unspecified: Secondary | ICD-10-CM | POA: Diagnosis not present

## 2020-12-24 DIAGNOSIS — Z79899 Other long term (current) drug therapy: Secondary | ICD-10-CM

## 2020-12-24 DIAGNOSIS — H469 Unspecified optic neuritis: Secondary | ICD-10-CM | POA: Diagnosis present

## 2020-12-24 DIAGNOSIS — F17210 Nicotine dependence, cigarettes, uncomplicated: Secondary | ICD-10-CM | POA: Diagnosis present

## 2020-12-24 DIAGNOSIS — G35 Multiple sclerosis: Secondary | ICD-10-CM | POA: Diagnosis present

## 2020-12-24 DIAGNOSIS — E876 Hypokalemia: Secondary | ICD-10-CM | POA: Diagnosis present

## 2020-12-24 DIAGNOSIS — D72829 Elevated white blood cell count, unspecified: Secondary | ICD-10-CM | POA: Diagnosis not present

## 2020-12-24 DIAGNOSIS — Z6835 Body mass index (BMI) 35.0-35.9, adult: Secondary | ICD-10-CM | POA: Diagnosis not present

## 2020-12-24 DIAGNOSIS — E162 Hypoglycemia, unspecified: Secondary | ICD-10-CM | POA: Diagnosis present

## 2020-12-24 DIAGNOSIS — Z833 Family history of diabetes mellitus: Secondary | ICD-10-CM

## 2020-12-24 DIAGNOSIS — Z716 Tobacco abuse counseling: Secondary | ICD-10-CM

## 2020-12-24 DIAGNOSIS — Y92239 Unspecified place in hospital as the place of occurrence of the external cause: Secondary | ICD-10-CM | POA: Diagnosis not present

## 2020-12-24 DIAGNOSIS — E538 Deficiency of other specified B group vitamins: Secondary | ICD-10-CM | POA: Diagnosis present

## 2020-12-24 DIAGNOSIS — E669 Obesity, unspecified: Secondary | ICD-10-CM | POA: Diagnosis present

## 2020-12-24 DIAGNOSIS — H47011 Ischemic optic neuropathy, right eye: Secondary | ICD-10-CM

## 2020-12-24 DIAGNOSIS — Z7982 Long term (current) use of aspirin: Secondary | ICD-10-CM

## 2020-12-24 LAB — CBC WITH DIFFERENTIAL/PLATELET
Abs Immature Granulocytes: 0.02 10*3/uL (ref 0.00–0.07)
Basophils Absolute: 0.1 10*3/uL (ref 0.0–0.1)
Basophils Relative: 1 %
Eosinophils Absolute: 0.2 10*3/uL (ref 0.0–0.5)
Eosinophils Relative: 3 %
HCT: 49.1 % (ref 39.0–52.0)
Hemoglobin: 16.6 g/dL (ref 13.0–17.0)
Immature Granulocytes: 0 %
Lymphocytes Relative: 36 %
Lymphs Abs: 2.7 10*3/uL (ref 0.7–4.0)
MCH: 31.7 pg (ref 26.0–34.0)
MCHC: 33.8 g/dL (ref 30.0–36.0)
MCV: 93.7 fL (ref 80.0–100.0)
Monocytes Absolute: 0.3 10*3/uL (ref 0.1–1.0)
Monocytes Relative: 5 %
Neutro Abs: 4.2 10*3/uL (ref 1.7–7.7)
Neutrophils Relative %: 55 %
Platelets: 306 10*3/uL (ref 150–400)
RBC: 5.24 MIL/uL (ref 4.22–5.81)
RDW: 12.7 % (ref 11.5–15.5)
WBC: 7.4 10*3/uL (ref 4.0–10.5)
nRBC: 0 % (ref 0.0–0.2)

## 2020-12-24 LAB — BASIC METABOLIC PANEL
Anion gap: 7 (ref 5–15)
BUN: 15 mg/dL (ref 6–20)
CO2: 27 mmol/L (ref 22–32)
Calcium: 8.7 mg/dL — ABNORMAL LOW (ref 8.9–10.3)
Chloride: 104 mmol/L (ref 98–111)
Creatinine, Ser: 1.19 mg/dL (ref 0.61–1.24)
GFR, Estimated: >60 mL/min (ref >60)
Glucose, Bld: 121 mg/dL — ABNORMAL HIGH (ref 70–99)
Potassium: 5 mmol/L (ref 3.5–5.1)
Sodium: 138 mmol/L (ref 135–145)

## 2020-12-24 LAB — SEDIMENTATION RATE: Sed Rate: 1 mm/hr (ref 0–20)

## 2020-12-24 LAB — C-REACTIVE PROTEIN: CRP: <0.5 mg/dL (ref <1.0)

## 2020-12-24 LAB — CBG MONITORING, ED: Glucose-Capillary: 233 mg/dL — ABNORMAL HIGH (ref 70–99)

## 2020-12-24 MED ORDER — POLYETHYLENE GLYCOL 3350 17 G PO PACK: 17.0000 g | PACK | Freq: Every day | ORAL | Status: DC | PRN

## 2020-12-24 MED ORDER — INSULIN ASPART 100 UNIT/ML IJ SOLN: 0.0000 [IU] | Freq: Three times a day (TID) | INTRAMUSCULAR | Status: DC

## 2020-12-24 MED ORDER — ONDANSETRON HCL 4 MG/2ML IJ SOLN: 4.0000 mg | Freq: Four times a day (QID) | INTRAMUSCULAR | Status: DC | PRN

## 2020-12-24 MED ORDER — NICOTINE 14 MG/24HR TD PT24
14.0000 mg | MEDICATED_PATCH | Freq: Every day | TRANSDERMAL | Status: DC
  Filled 2020-12-24 (×3): qty 1

## 2020-12-24 MED ORDER — AMLODIPINE BESYLATE 5 MG PO TABS
5.0000 mg | ORAL_TABLET | Freq: Every day | ORAL | Status: DC
  Administered 2020-12-24 – 2020-12-28 (×5): 5 mg via ORAL
  Filled 2020-12-24 (×5): qty 1

## 2020-12-24 MED ORDER — LISINOPRIL 5 MG PO TABS: 5.0000 mg | ORAL_TABLET | Freq: Every day | ORAL | Status: DC

## 2020-12-24 MED ORDER — ENOXAPARIN SODIUM 40 MG/0.4ML IJ SOSY
40.0000 mg | PREFILLED_SYRINGE | INTRAMUSCULAR | Status: DC
  Filled 2020-12-24 (×2): qty 0.4

## 2020-12-24 MED ORDER — ROSUVASTATIN CALCIUM 10 MG PO TABS
5.0000 mg | ORAL_TABLET | Freq: Every day | ORAL | Status: DC
  Administered 2020-12-24 – 2020-12-27 (×4): 5 mg via ORAL
  Filled 2020-12-24 (×6): qty 1

## 2020-12-24 MED ORDER — MELATONIN 5 MG PO TABS
2.5000 mg | ORAL_TABLET | Freq: Every evening | ORAL | Status: DC | PRN
  Administered 2020-12-24: 2.5 mg via ORAL
  Filled 2020-12-24: qty 1

## 2020-12-24 MED ORDER — ACETAMINOPHEN 325 MG PO TABS
650.0000 mg | ORAL_TABLET | Freq: Four times a day (QID) | ORAL | Status: DC | PRN
  Administered 2020-12-24: 650 mg via ORAL
  Filled 2020-12-24: qty 2

## 2020-12-24 MED ORDER — SODIUM CHLORIDE 0.9 % IV SOLN
1000.0000 mg | Freq: Once | INTRAVENOUS | Status: AC
  Administered 2020-12-24: 1000 mg via INTRAVENOUS
  Filled 2020-12-24: qty 16

## 2020-12-24 MED ORDER — GABAPENTIN 100 MG PO CAPS
100.0000 mg | ORAL_CAPSULE | Freq: Three times a day (TID) | ORAL | Status: DC
  Administered 2020-12-24 – 2020-12-28 (×12): 100 mg via ORAL
  Filled 2020-12-24 (×12): qty 1

## 2020-12-24 MED ORDER — GADOBUTROL 1 MMOL/ML IV SOLN
10.0000 mL | Freq: Once | INTRAVENOUS | Status: AC | PRN
  Administered 2020-12-24: 10 mL via INTRAVENOUS
  Filled 2020-12-24: qty 10

## 2020-12-24 MED ORDER — PANTOPRAZOLE SODIUM 40 MG IV SOLR
40.0000 mg | Freq: Once | INTRAVENOUS | Status: AC
  Administered 2020-12-24: 40 mg via INTRAVENOUS
  Filled 2020-12-24: qty 40

## 2020-12-24 MED ORDER — INSULIN ASPART 100 UNIT/ML IJ SOLN: 0.0000 [IU] | Freq: Every day | INTRAMUSCULAR | Status: DC

## 2020-12-24 NOTE — H&P (Addendum)
History and Physical  Bill Perez WCB:[id number] DOB: 01-14-1971 DOA: 12/24/2020  Referring physician: Dr Erma Heritage, EDP  PCP: Danelle Berry, PA-C  Outpatient Specialists: Ophtalmology Patient coming from: Home through ophthalmologist office.  Chief Complaint: painless loss of vision [name]   HPI: Bill Perez is a 50 y.o. male with medical history significant for HTN, HLD, obesity who presented to [facility] ED at the recommendation of his ophthalmologist due to painless [name] vision loss of 1 week duration.  He first noticed while watching a football game and driving at night last Friday, a week ago.  He had no photophobia or phonophobia.  No prior similar symptoms.  He went to see his ophthalmologist on the day of presentation.  Eye exam revealed optic neuritis.  He was asked to come to the ED for further evaluation.  In the ED MRI brain with and without contrast as well as MRI of orbit without and with contrast showed right optic neuritis/perineuritis.  Appears suspicious for primary demyelinating disease most likely multiple sclerosis with areas of active demyelination.  Neurology was consulted by EDP and recommended IV Solu-Medrol 1000 mg x 1 dose.  Neurology will see in consultation in the morning.  TR[facility] was asked to admit.  ED Course: Temperature 98.7.  BP 147/93, pulse 72, respiration rate 16, O2 saturation 97% on room air.  Review of Systems: Review of systems as noted in the HPI. All other systems reviewed and are negative.   Past Medical History:  Diagnosis Date   Hyperlipidemia    Hypertension    History reviewed. No pertinent surgical history.  Social History:  reports that he has been smoking cigarettes. He has a 12.50 pack-year smoking history. He has never used smokeless tobacco. He reports current alcohol use of about 4.0 standard drinks per week. He reports current drug use.   No Known Allergies  Family History  Problem Relation Age of Onset   Hypertension Mother     Hyperlipidemia Mother    Stroke Father    Diabetes Father       Prior to Admission medications   Medication Sig Start Date End Date Taking? Authorizing Provider  aspirin 81 MG tablet Take 81 mg by mouth daily.   Yes [provider]  gabapentin (NEURONTIN) 100 MG capsule One tab PO qHS for a week, then BID for a week, then TID. 12/02/20  Yes Jerrol Banana, MD  lisinopril (ZESTRIL) 2.5 MG tablet TAKE 1 TABLET(2.5 MG) BY MOUTH DAILY 05/18/20  Yes Danelle Berry, PA-C  meloxicam (MOBIC) 15 MG tablet Take 1 tablet (15 mg total) by mouth daily. 12/02/20  Yes Jerrol Banana, MD  rosuvastatin (CRESTOR) 5 MG tablet Take 1 tablet (5 mg total) by mouth at bedtime. 05/18/20  Yes Danelle Berry, PA-C  tiZANidine (ZANAFLEX) 4 MG tablet Take 0.5-1.5 tablets (2-6 mg total) by mouth every 8 (eight) hours as needed for muscle spasms (muscle tightness). 11/15/20   Danelle Berry, PA-C    Physical Exam: BP (!) 147/93 (BP Location: Left Arm)   Pulse 72   Temp 98.7 F (37.1 C) (Oral)   Resp 16   SpO2 97%   General: 50 y.o. year-old male well developed well nourished in no acute distress.  Alert and oriented x3. Cardiovascular: Regular rate and rhythm with no rubs or gallops.  No thyromegaly or JVD noted.  No lower extremity edema. 2/4 pulses in all 4 extremities. Respiratory: Clear to auscultation with no wheezes or rales. Good inspiratory  effort. Abdomen: Soft nontender nondistended with normal bowel sounds x4 quadrants. Muskuloskeletal: No cyanosis, clubbing or edema noted bilaterally Neuro: CN II-XII intact, strength, sensation, reflexes Skin: No ulcerative lesions noted or rashes Psychiatry: Judgement and insight appear normal. Mood is appropriate for condition and setting          Labs on Admission:  Basic Metabolic Panel: Recent Labs  Lab 12/24/20 1415  NA 138  K 5.0  CL 104  CO2 27  GLUCOSE 121*  BUN 15  CREATININE 1.19  CALCIUM 8.7*   Liver Function Tests: No results for  input(s): AST, ALT, ALKPHOS, BILITOT, PROT, ALBUMIN in the last 168 hours. No results for input(s): LIPASE, AMYLASE in the last 168 hours. No results for input(s): AMMONIA in the last 168 hours. CBC: Recent Labs  Lab 12/24/20 1415  WBC 7.4  NEUTROABS 4.2  HGB 16.6  HCT 49.1  MCV 93.7  PLT 306   Cardiac Enzymes: No results for input(s): CKTOTAL, CKMB, CKMBINDEX, TROPONINI in the last 168 hours.  BNP (last 3 results) No results for input(s): BNP in the last 8760 hours.  ProBNP (last 3 results) No results for input(s): PROBNP in the last 8760 hours.  CBG: No results for input(s): GLUCAP in the last 168 hours.  Radiological Exams on Admission: MR BRAIN W WO CONTRAST  Result Date: 12/24/2020 CLINICAL DATA:  Abnormal right vision EXAM: MRI HEAD AND ORBITS WITHOUT AND WITH CONTRAST TECHNIQUE: Multiplanar, multiecho pulse sequences of the brain and surrounding structures were obtained without and with intravenous contrast. Multiplanar, multiecho pulse sequences of the orbits and surrounding structures were obtained including fat saturation techniques, before and after intravenous contrast administration. CONTRAST:  64mL GADAVIST GADOBUTROL 1 MMOL/ML IV SOLN COMPARISON:  None. FINDINGS: MRI HEAD FINDINGS Brain: There is no acute infarction. There are multiple foci of T2 hyperintensity in the periventricular greater than subcortical white matter. Involvement of the corpus callosum and juxtacortical white matter is noted. Minimal involvement of the cerebellum is noted. A few foci of corresponding enhancement are identified (for example series 18, images seventy, 95, 103, 110, 122, 126). Prominence of the ventricles and sulci reflects mild generalized parenchymal volume loss greater than expected for age. Some of the above lesions demonstrate marked T1 hypointensity centrally. There is no intracranial mass or mass effect. No hydrocephalus or extra-axial collection. Vascular: Major vessel flow voids  at the skull base are preserved. Skull and upper cervical spine: Marrow signal is within normal limits. Other: Mastoid air cells are clear.  Sella is unremarkable. MRI ORBITS FINDINGS Orbits: No proptosis. No intraorbital mass. Globes, extraocular muscles, and lacrimal glands are symmetric and unremarkable. There is abnormal enhancement primarily about but also of the intraorbital right optic nerve. Increased T2 signal is seen within the intraorbital right optic nerve. Visualized sinuses: Unremarkable. Soft tissues: Unremarkable. IMPRESSION: Right optic neuritis/perineuritis. Appearance suspicious for primary demyelinating disease (most likely multiple sclerosis) with areas of active demyelination. Electronically Signed   By: Guadlupe Spanish M.D.   On: 12/24/2020 17:32   MR ORBITS W WO CONTRAST  Result Date: 12/24/2020 CLINICAL DATA:  Abnormal right vision EXAM: MRI HEAD AND ORBITS WITHOUT AND WITH CONTRAST TECHNIQUE: Multiplanar, multiecho pulse sequences of the brain and surrounding structures were obtained without and with intravenous contrast. Multiplanar, multiecho pulse sequences of the orbits and surrounding structures were obtained including fat saturation techniques, before and after intravenous contrast administration. CONTRAST:  53mL GADAVIST GADOBUTROL 1 MMOL/ML IV SOLN COMPARISON:  None. FINDINGS: MRI HEAD FINDINGS Brain:  There is no acute infarction. There are multiple foci of T2 hyperintensity in the periventricular greater than subcortical white matter. Involvement of the corpus callosum and juxtacortical white matter is noted. Minimal involvement of the cerebellum is noted. A few foci of corresponding enhancement are identified (for example series 18, images seventy, 95, 103, 110, 122, 126). Prominence of the ventricles and sulci reflects mild generalized parenchymal volume loss greater than expected for age. Some of the above lesions demonstrate marked T1 hypointensity centrally. There is no  intracranial mass or mass effect. No hydrocephalus or extra-axial collection. Vascular: Major vessel flow voids at the skull base are preserved. Skull and upper cervical spine: Marrow signal is within normal limits. Other: Mastoid air cells are clear.  Sella is unremarkable. MRI ORBITS FINDINGS Orbits: No proptosis. No intraorbital mass. Globes, extraocular muscles, and lacrimal glands are symmetric and unremarkable. There is abnormal enhancement primarily about but also of the intraorbital right optic nerve. Increased T2 signal is seen within the intraorbital right optic nerve. Visualized sinuses: Unremarkable. Soft tissues: Unremarkable. IMPRESSION: Right optic neuritis/perineuritis. Appearance suspicious for primary demyelinating disease (most likely multiple sclerosis) with areas of active demyelination. Electronically Signed   By: Guadlupe Spanish M.D.   On: 12/24/2020 17:32    EKG: I independently viewed the EKG done and my findings are as followed: None available at the time of the visit.  Assessment/Plan Present on Admission:  MS (multiple sclerosis) (HCC)  Active Problems:   MS (multiple sclerosis) (HCC)  Right optic neuritis/perineuritis with concern for primary demyelinating disease most likely multiple sclerosis, seen on MRI brain. Presented with right eye vision loss x1 week.  Was sent from ophthalmologist office after eye exam showed optic neuritis. MRI brain results as stated above. MRI cervical spine with and without contrast/MRI thoracic spine with and without contrast pending. Neurology consulted will see in consultation in the morning.   Recommended 1 g IV Solu-Medrol. Fall precautions  Hypertension BP stable Hold off lisinopril due to mild hyperkalemia Start Norvasc 5 mg daily.  Mild hypokalemia Potassium 5.0 Monitor for now Hold off lisinopril.  Hyperlipidemia Resume home Crestor  Steroid-induced hyperglycemia Insulin sliding scale  Tobacco use disorder Ongoing  tobacco use with Smokes 15 cigarettes/day since the age of 14. Nicotine patch    DVT prophylaxis: Subcu Lovenox daily  Code Status: Full code  Family Communication:  Loved one at bedside.  Disposition Plan: Admit to MedSurg unit remote telemetry.  Consults called: Neurology consulted by EDP.  Admission status: Inpatient status.  Patient will require at least 2 midnights for further evaluation and treatment of present condition.   Status is: Inpatient    Dispo: The patient is from: Home.              Anticipated d/c is to: Home.              Patient currently not medically stable for discharge.   Difficult to place patient, not applicable.       Darlin Drop MD [facility] Pager [phone]  If 7PM-7AM, please contact night-coverage [email or web address] Password [facility]  12/24/2020, 7:49 PM

## 2020-12-24 NOTE — ED Triage Notes (Signed)
Pt sent from [facility] with loss of vision in the right eye a week ago, states he was sent over here for imaging. Denies any other sx.

## 2020-12-24 NOTE — ED Provider Notes (Signed)
[facility] Emergency Department Provider Note  ____________________________________________   Event Date/Time   First MD Initiated Contact with Patient 12/24/20 1705     (approximate)  I have reviewed the triage vital signs and the nursing notes.   HISTORY  Chief Complaint Loss of Vision    HPI Bill Perez is a 50 y.o. male  here with loss of vision. Pt reports painless loss of vision of [name] over the past week.  Pt reports he noticed it while watching a football game and driving at night on Friday. Reports that he has had no pain, no photophobia or phonophobia. No neck pain or neck stiffness. No new focal or weakness. No h/o similar symptoms. He went to see Ophtho today who sent him to the ED for further evaluation. He has persistent diminished vision, states he can see light/dark but that's about it. No recent trauma. No pain with EOM. No eye/facial redness.        Past Medical History:  Diagnosis Date   Hyperlipidemia    Hypertension     Patient Active Problem List   Diagnosis Date Noted   MS (multiple sclerosis) (HCC) 12/24/2020   Meralgia paresthetica of left side 12/02/2020   Tobacco abuse counseling 10/29/2017   Class 2 severe obesity due to excess calories with serious comorbidity and body mass index (BMI) of 35.0 to 35.9 in adult (HCC) 10/29/2017   Muscle strain of thigh 03/14/2017   Dyslipidemia 06/27/2016   Abdominal wall strain 03/15/2016    History reviewed. No pertinent surgical history.  Prior to Admission medications   Medication Sig Start Date End Date Taking? Authorizing Provider  aspirin 81 MG tablet Take 81 mg by mouth daily.   Yes [provider]  gabapentin (NEURONTIN) 100 MG capsule One tab PO qHS for a week, then BID for a week, then TID. 12/02/20  Yes Jerrol Banana, MD  lisinopril (ZESTRIL) 2.5 MG tablet TAKE 1 TABLET(2.5 MG) BY MOUTH DAILY 05/18/20  Yes Danelle Berry, PA-C  meloxicam (MOBIC) 15 MG  tablet Take 1 tablet (15 mg total) by mouth daily. 12/02/20  Yes Jerrol Banana, MD  rosuvastatin (CRESTOR) 5 MG tablet Take 1 tablet (5 mg total) by mouth at bedtime. 05/18/20  Yes Danelle Berry, PA-C  tiZANidine (ZANAFLEX) 4 MG tablet Take 0.5-1.5 tablets (2-6 mg total) by mouth every 8 (eight) hours as needed for muscle spasms (muscle tightness). 11/15/20   Danelle Berry, PA-C    Allergies Patient has no known allergies.  Family History  Problem Relation Age of Onset   Hypertension Mother    Hyperlipidemia Mother    Stroke Father    Diabetes Father     Social History Social History   Tobacco Use   Smoking status: Every Day    Packs/day: 0.50    Years: 25.00    Pack years: 12.50    Types: Cigarettes   Smokeless tobacco: Never  Vaping Use   Vaping Use: Never used  Substance Use Topics   Alcohol use: Yes    Alcohol/week: 4.0 standard drinks    Types: 4 Glasses of wine per week   Drug use: Yes    Review of Systems  Review of Systems  Constitutional:  Negative for chills and fever.  HENT:  Negative for sore throat.   Eyes:  Positive for visual disturbance.  Respiratory:  Negative for shortness of breath.   Cardiovascular:  Negative for chest pain.  Gastrointestinal:  Negative for  abdominal pain.  Genitourinary:  Negative for flank pain.  Musculoskeletal:  Negative for neck pain.  Skin:  Negative for rash and wound.  Allergic/Immunologic: Negative for immunocompromised state.  Neurological:  Negative for weakness and numbness.  Hematological:  Does not bruise/bleed easily.  All other systems reviewed and are negative.   ____________________________________________  PHYSICAL EXAM:      VITAL SIGNS: ED Triage Vitals  Enc Vitals Group     BP 12/24/20 1449 (!) 147/93     Pulse Rate 12/24/20 1439 73     Resp 12/24/20 1449 16     Temp 12/24/20 1449 98.7 F (37.1 C)     Temp Source 12/24/20 1449 Oral     SpO2 12/24/20 1439 97 %     Weight --      Height --       Head Circumference --      Peak Flow --      Pain Score 12/24/20 1403 0     Pain Loc --      Pain Edu? --      Excl. in GC? --      Physical Exam Vitals and nursing note reviewed.  Constitutional:      General: He is not in acute distress.    Appearance: He is well-developed.  HENT:     Head: Normocephalic and atraumatic.  Eyes:     Conjunctiva/sclera: Conjunctivae normal.  Cardiovascular:     Rate and Rhythm: Normal rate and regular rhythm.     Heart sounds: Normal heart sounds.  Pulmonary:     Effort: Pulmonary effort is normal. No respiratory distress.     Breath sounds: No wheezing.  Abdominal:     General: There is no distension.  Musculoskeletal:     Cervical back: Neck supple.  Skin:    General: Skin is warm.     Capillary Refill: Capillary refill takes less than 2 seconds.     Findings: No rash.  Neurological:     Mental Status: He is alert and oriented to person, place, and time.     Motor: No abnormal muscle tone.     Comments: Vision intact to essentially light/dark only on R. Otherwise, EOMI. No CN deficits. Strength 5/5 bl UE and LE. Normal sensation to light touch. Gait normal.       ____________________________________________   LABS (all labs ordered are listed, but only abnormal results are displayed)  Labs Reviewed  BASIC METABOLIC PANEL - Abnormal; Notable for the following components:      Result Value   Glucose, Bld 121 (*)    Calcium 8.7 (*)    All other components within normal limits  SEDIMENTATION RATE  CBC WITH DIFFERENTIAL/PLATELET  C-REACTIVE PROTEIN    ____________________________________________  EKG:  ________________________________________  RADIOLOGY All imaging, including plain films, CT scans, and ultrasounds, independently reviewed by me, and interpretations confirmed via formal radiology reads.  ED MD interpretation:   MR Brain WWO: Right optic neuritis, perineuritis, appearance suspicious for primary demyelinating  disease  Official radiology report(s): MR BRAIN W WO CONTRAST  Result Date: 12/24/2020 CLINICAL DATA:  Abnormal right vision EXAM: MRI HEAD AND ORBITS WITHOUT AND WITH CONTRAST TECHNIQUE: Multiplanar, multiecho pulse sequences of the brain and surrounding structures were obtained without and with intravenous contrast. Multiplanar, multiecho pulse sequences of the orbits and surrounding structures were obtained including fat saturation techniques, before and after intravenous contrast administration. CONTRAST:  66mL GADAVIST GADOBUTROL 1 MMOL/ML IV SOLN COMPARISON:  None.  FINDINGS: MRI HEAD FINDINGS Brain: There is no acute infarction. There are multiple foci of T2 hyperintensity in the periventricular greater than subcortical white matter. Involvement of the corpus callosum and juxtacortical white matter is noted. Minimal involvement of the cerebellum is noted. A few foci of corresponding enhancement are identified (for example series 18, images seventy, 95, 103, 110, 122, 126). Prominence of the ventricles and sulci reflects mild generalized parenchymal volume loss greater than expected for age. Some of the above lesions demonstrate marked T1 hypointensity centrally. There is no intracranial mass or mass effect. No hydrocephalus or extra-axial collection. Vascular: Major vessel flow voids at the skull base are preserved. Skull and upper cervical spine: Marrow signal is within normal limits. Other: Mastoid air cells are clear.  Sella is unremarkable. MRI ORBITS FINDINGS Orbits: No proptosis. No intraorbital mass. Globes, extraocular muscles, and lacrimal glands are symmetric and unremarkable. There is abnormal enhancement primarily about but also of the intraorbital right optic nerve. Increased T2 signal is seen within the intraorbital right optic nerve. Visualized sinuses: Unremarkable. Soft tissues: Unremarkable. IMPRESSION: Right optic neuritis/perineuritis. Appearance suspicious for primary demyelinating  disease (most likely multiple sclerosis) with areas of active demyelination. Electronically Signed   By: Guadlupe Spanish M.D.   On: 12/24/2020 17:32   MR ORBITS W WO CONTRAST  Result Date: 12/24/2020 CLINICAL DATA:  Abnormal right vision EXAM: MRI HEAD AND ORBITS WITHOUT AND WITH CONTRAST TECHNIQUE: Multiplanar, multiecho pulse sequences of the brain and surrounding structures were obtained without and with intravenous contrast. Multiplanar, multiecho pulse sequences of the orbits and surrounding structures were obtained including fat saturation techniques, before and after intravenous contrast administration. CONTRAST:  29mL GADAVIST GADOBUTROL 1 MMOL/ML IV SOLN COMPARISON:  None. FINDINGS: MRI HEAD FINDINGS Brain: There is no acute infarction. There are multiple foci of T2 hyperintensity in the periventricular greater than subcortical white matter. Involvement of the corpus callosum and juxtacortical white matter is noted. Minimal involvement of the cerebellum is noted. A few foci of corresponding enhancement are identified (for example series 18, images seventy, 95, 103, 110, 122, 126). Prominence of the ventricles and sulci reflects mild generalized parenchymal volume loss greater than expected for age. Some of the above lesions demonstrate marked T1 hypointensity centrally. There is no intracranial mass or mass effect. No hydrocephalus or extra-axial collection. Vascular: Major vessel flow voids at the skull base are preserved. Skull and upper cervical spine: Marrow signal is within normal limits. Other: Mastoid air cells are clear.  Sella is unremarkable. MRI ORBITS FINDINGS Orbits: No proptosis. No intraorbital mass. Globes, extraocular muscles, and lacrimal glands are symmetric and unremarkable. There is abnormal enhancement primarily about but also of the intraorbital right optic nerve. Increased T2 signal is seen within the intraorbital right optic nerve. Visualized sinuses: Unremarkable. Soft tissues:  Unremarkable. IMPRESSION: Right optic neuritis/perineuritis. Appearance suspicious for primary demyelinating disease (most likely multiple sclerosis) with areas of active demyelination. Electronically Signed   By: Guadlupe Spanish M.D.   On: 12/24/2020 17:32    ____________________________________________  PROCEDURES   Procedure(s) performed (including Critical Care):  Procedures  ____________________________________________  INITIAL IMPRESSION / MDM / ASSESSMENT AND PLAN / ED COURSE  As part of my medical decision making, I reviewed the following data within the electronic MEDICAL RECORD NUMBER Nursing notes reviewed and incorporated, Old chart reviewed, Notes from prior ED visits, and [place] Controlled Substance Database       *Bill Perez was evaluated in Emergency Department on 12/24/2020 for the symptoms described  in the history of present illness. He was evaluated in the context of the global COVID-19 pandemic, which necessitated consideration that the patient might be at risk for infection with the SARS-CoV-2 virus that causes COVID-19. Institutional protocols and algorithms that pertain to the evaluation of patients at risk for COVID-19 are in a state of rapid change based on information released by regulatory bodies including the CDC and federal and state organizations. These policies and algorithms were followed during the patient's care in the ED.  Some ED evaluations and interventions may be delayed as a result of limited staffing during the pandemic.*     Medical Decision Making:  50 yo M here with R visual loss. Pt is otherwise neurologically intact. MR obtained, reviewed, and is concerning for MS with active optic neuritis. Discussed with Neurology, will admit for IV steroids. 1000 mg ordered here, with protonix for GI ppx. Admit to medicine. Pt, wife updated and questions answered. Labs otherwise reassuring. Normal WBC and  lytes.  ____________________________________________  FINAL CLINICAL IMPRESSION(S) / ED DIAGNOSES  Final diagnoses:  Multiple sclerosis (HCC)     MEDICATIONS GIVEN DURING THIS VISIT:  Medications  methylPREDNISolone sodium succinate (SOLU-MEDROL) 1,000 mg in sodium chloride 0.9 % 50 mL IVPB (has no administration in time range)  pantoprazole (PROTONIX) injection 40 mg (has no administration in time range)  gadobutrol (GADAVIST) 1 MMOL/ML injection 10 mL (10 mLs Intravenous Contrast Given 12/24/20 1707)     ED Discharge Orders     None        Note:  This document was prepared using Dragon voice recognition software and may include unintentional dictation errors.   Shaune Pollack, MD 12/24/20 [phone]

## 2020-12-24 NOTE — ED Notes (Signed)
Pt report no vision to Rt eye w/ pain on back of head. Pt on bedside in good spirits/ calm. NADN

## 2020-12-25 ENCOUNTER — Inpatient Hospital Stay: Payer: No Typology Code available for payment source

## 2020-12-25 DIAGNOSIS — H469 Unspecified optic neuritis: Secondary | ICD-10-CM | POA: Diagnosis not present

## 2020-12-25 DIAGNOSIS — G35 Multiple sclerosis: Secondary | ICD-10-CM | POA: Diagnosis not present

## 2020-12-25 LAB — CBC
HCT: 50.4 % (ref 39.0–52.0)
Hemoglobin: 16.1 g/dL (ref 13.0–17.0)
MCH: 29.6 pg (ref 26.0–34.0)
MCHC: 31.9 g/dL (ref 30.0–36.0)
MCV: 92.6 fL (ref 80.0–100.0)
Platelets: 333 10*3/uL (ref 150–400)
RBC: 5.44 MIL/uL (ref 4.22–5.81)
RDW: 12.5 % (ref 11.5–15.5)
WBC: 8.3 10*3/uL (ref 4.0–10.5)
nRBC: 0 % (ref 0.0–0.2)

## 2020-12-25 LAB — COMPREHENSIVE METABOLIC PANEL
ALT: 26 U/L (ref 0–44)
AST: 23 U/L (ref 15–41)
Albumin: 4 g/dL (ref 3.5–5.0)
Alkaline Phosphatase: 80 U/L (ref 38–126)
Anion gap: 8 (ref 5–15)
BUN: 19 mg/dL (ref 6–20)
CO2: 25 mmol/L (ref 22–32)
Calcium: 9.3 mg/dL (ref 8.9–10.3)
Chloride: 106 mmol/L (ref 98–111)
Creatinine, Ser: 1.18 mg/dL (ref 0.61–1.24)
GFR, Estimated: >60 mL/min (ref >60)
Glucose, Bld: 158 mg/dL — ABNORMAL HIGH (ref 70–99)
Potassium: 5 mmol/L (ref 3.5–5.1)
Sodium: 139 mmol/L (ref 135–145)
Total Bilirubin: 1.1 mg/dL (ref 0.3–1.2)
Total Protein: 7.9 g/dL (ref 6.5–8.1)

## 2020-12-25 LAB — HEMOGLOBIN A1C
Hgb A1c MFr Bld: 4.8 % (ref 4.8–5.6)
Mean Plasma Glucose: 91.06 mg/dL

## 2020-12-25 LAB — RESP PANEL BY RT-PCR (FLU A&B, COVID) ARPGX2
Influenza A by PCR: NEGATIVE
Influenza B by PCR: NEGATIVE
SARS Coronavirus 2 by RT PCR: NEGATIVE

## 2020-12-25 LAB — MAGNESIUM: Magnesium: 1.9 mg/dL (ref 1.7–2.4)

## 2020-12-25 LAB — CBG MONITORING, ED: Glucose-Capillary: 160 mg/dL — ABNORMAL HIGH (ref 70–99)

## 2020-12-25 LAB — FOLATE: Folate: 5.5 ng/mL — ABNORMAL LOW (ref >5.9)

## 2020-12-25 LAB — GLUCOSE, CAPILLARY
Glucose-Capillary: 131 mg/dL — ABNORMAL HIGH (ref 70–99)
Glucose-Capillary: 160 mg/dL — ABNORMAL HIGH (ref 70–99)

## 2020-12-25 LAB — VITAMIN D 25 HYDROXY (VIT D DEFICIENCY, FRACTURES): Vit D, 25-Hydroxy: 14.87 ng/mL — ABNORMAL LOW (ref 30–100)

## 2020-12-25 LAB — VITAMIN B12: Vitamin B-12: 377 pg/mL (ref 180–914)

## 2020-12-25 LAB — HIV ANTIBODY (ROUTINE TESTING W REFLEX): HIV Screen 4th Generation wRfx: NONREACTIVE

## 2020-12-25 MED ORDER — METHYLPREDNISOLONE SODIUM SUCC 1000 MG IJ SOLR
1000.0000 mg | INTRAMUSCULAR | Status: DC
  Administered 2020-12-25 – 2020-12-27 (×3): 1000 mg via INTRAVENOUS
  Filled 2020-12-25 (×3): qty 16

## 2020-12-25 MED ORDER — GADOBUTROL 1 MMOL/ML IV SOLN
10.0000 mL | Freq: Once | INTRAVENOUS | Status: AC | PRN
  Administered 2020-12-25: 10 mL via INTRAVENOUS

## 2020-12-25 NOTE — ED Notes (Signed)
Report given to Tom, RN.

## 2020-12-25 NOTE — Evaluation (Signed)
Occupational Therapy Evaluation Patient Details Name: Bill Perez MRN: [id number] DOB: Sep 14, 1970 Today's Date: 12/25/2020   History of Present Illness Pt is a 50 y/o M with PMH: HTN adn HLD who presented to [facility] ED at recommendation of his ophthalmologist due to painless [name] vision loss of 1 week duration. Eye exam revealed optic neuritis. Came to ED for further w/u. Imaging included: MRI of brain with and without contrast as well as MRI of orbit without and with contrast showed right optic neuritis/perineuritis. MD assessment states: Appears suspicious for primary demyelinating disease most likely multiple sclerosis   Clinical Impression   Pt seen for OT evaluation this date in setting of acute hospitalization d/t vision changes. Pt presents this date reporting some improvement since starting a steroid regimen. Pt reports being INDEP at baseline including working and driving. Pt presents this date with decreased [name] vision including central and peripheral noted on OT assessment. For purpose of basic ADLs performing in room to assess function and INDEP, pt requires no assistance. However, based on clinical observation, could ascertain that some IADLs such as driving might be more difficult for patient at this time. OT provided safety education and requests that pt f/u with physician about safety with driving or driving recommendations before discharge. No further acute OT needs detected at this time. Pt could potentially f/u with OP vision OT specialist should symptoms worsen or persist.      Recommendations for follow up therapy are one component of a multi-disciplinary discharge planning process, led by the attending physician.  Recommendations may be updated based on patient status, additional functional criteria and insurance authorization.   Follow Up Recommendations  Outpatient OT (could seek OP OT speciliast for vision)    Equipment Recommendations  None recommended by OT     Recommendations for Other Services       Precautions / Restrictions Precautions Precautions: Fall Restrictions Weight Bearing Restrictions: No      Mobility Bed Mobility Overal bed mobility: Independent                  Transfers Overall transfer level: Modified independent               General transfer comment: some increased time required for STS d/t pt with pain in L LE from self-reported pinched nerve. Overall steady and does not require physical assist or AD    Balance Overall balance assessment: Independent                                         ADL either performed or assessed with clinical judgement   ADL Overall ADL's : Modified independent                                       General ADL Comments: cues for scanning to locate items in R visual field. demos understanding and carryover on subsequent trials. No physical assist needed to complete any ADLs.     Vision Ability to See in Adequate Light: 2 Moderately impaired ([name] only) Patient Visual Report: Blurring of vision;Peripheral vision impairment Vision Assessment?: Yes Eye Alignment: Within Functional Limits Ocular Range of Motion: Within Functional Limits Alignment/Gaze Preference: Within Defined Limits Tracking/Visual Pursuits: Impaired - to be further tested in functional context;Requires cues, head turns,  or add eye shifts to track (decreased tracking in R hemisphere, endorses that sometimes he will be able to make out a blurry picture with [name] as it seems to be improving on steroids this admission. Endorses that it was completely blank at one point when looking out [name].) Saccades: Within functional limits Convergence: Within functional limits Additional Comments: decreased vision to [name] although pt reports improving since started on a steroid regimen during hospital stay.     Perception     Praxis      Pertinent Vitals/Pain Pain Assessment:  No/denies pain     Hand Dominance Right   Extremity/Trunk Assessment Upper Extremity Assessment Upper Extremity Assessment: Overall WFL for tasks assessed (thorough assessment of strength, ROM, coordination compelted in setting of concern for neurological condition. Pt appears to have equal and in tact sensation and demos funtional [facility] skills bilaterally. R grip slightly stronger than L and pt is R handed.)   Lower Extremity Assessment Lower Extremity Assessment: Overall WFL for tasks assessed;LLE deficits/detail LLE Deficits / Details: some decreased stability in L hip/knee. No gross LOB and demos appropriate ROM, but reports that he has been seeing a doc outpt for workup of a "pinched nerve" in the L hip.       Communication Communication Communication: No difficulties   Cognition Arousal/Alertness: Awake/alert Behavior During Therapy: WFL for tasks assessed/performed Overall Cognitive Status: Within Functional Limits for tasks assessed                                     General Comments       Exercises Other Exercises Other Exercises: OT ed with pt re: role of OT. OT also educates re: other neurological s/s to be aware of and seek assistance as it pertains to ADLs/IADLs: change in sensation, change in strength, other visual changes, change in FMC/GMC, change in balance. Pt with good understanding.   Shoulder Instructions      Home Living Family/patient expects to be discharged to:: Private residence Living Arrangements: Spouse/significant other Available Help at Discharge: Family;Available PRN/intermittently Type of Home: House Home Access: Stairs to enter           Bathroom Shower/Tub: Tub/shower unit         Home Equipment: None          Prior Functioning/Environment Level of Independence: Independent        Comments: working and driving, able to do yard work including mowing and weed-eating        OT Problem List: Impaired  vision/perception      OT Treatment/Interventions: Therapeutic activities;Visual/perceptual remediation/compensation    OT Goals(Current goals can be found in the care plan section) Acute Rehab OT Goals Patient Stated Goal: to go home OT Goal Formulation: All assessment and education complete, DC therapy  OT Frequency:     Barriers to D/C:            Co-evaluation              AM-PAC OT "6 Clicks" Daily Activity     Outcome Measure Help from another person eating meals?: None Help from another person taking care of personal grooming?: None Help from another person toileting, which includes using toliet, bedpan, or urinal?: None Help from another person bathing (including washing, rinsing, drying)?: None Help from another person to put on and taking off regular upper body clothing?: None Help from  another person to put on and taking off regular lower body clothing?: None 6 Click Score: 24   End of Session Nurse Communication: Other (comment) (vision status, no acute OT needs, could require OP f/u)  Activity Tolerance: Patient tolerated treatment well Patient left: Other (comment);with call bell/phone within reach;in bed  OT Visit Diagnosis: Other symptoms and signs involving the nervous system (J49.702)                Time: 6378-5885 OT Time Calculation (min): 8 min Charges:  OT General Charges $OT Visit: 1 Visit OT Evaluation $OT Eval Low Complexity: 1 Low  Rejeana Brock, MS, OTR/L ascom [phone] 12/25/20, 1:48 PM

## 2020-12-25 NOTE — Consult Note (Signed)
NEUROLOGY CONSULTATION NOTE   Date of service: December 25, 2020 Patient Name: Bill Perez MRN:  [id number] DOB:  1971-01-23 Reason for consult: R optic neuritis Requesting physician: Dr. Larae Grooms _ _ _   _ __   _ __ _ _  __ __   _ __   __ _  History of Present Illness   This is a 50 year old gentleman with past medical history significant for hypertension, hyperlipidemia, lumbar radiculopathy, no prior known history of demyelinating disease who presented to [facility] ED on December 24, 2020 at the recommendation of his ophthalmologist due to painless vision loss in his right eye for the past week.  He initially noticed his vision problems 7 days ago when he had difficulty driving at night and watching a football game.  It progressed to essentially near-total blindness in his right eye.  He has never had symptoms like this before.  Examination at his ophthalmologist revealed a right APD and there was appropriate concern for optic neuritis and therefore he was referred to the emergency department.  In the ED MRI brain and orbits with and without contrast showed contrast enhancement of the right optic nerve consistent with optic neuritis.  In addition there were numerous T2/FLAIR hyperintensities, ovoid, periventricular, with an appearance consistent with demyelinating disease (personal review).  These were separated in both space and time, as indicated by some of these lesions enhancing and some of them not, consistent with a new diagnosis of multiple sclerosis.  He does not report any prior history consistent with this and states that he has never had any neurologic deficits prior to the last week with the exception of chronic LLE pain and weakness 2/2 lumbar radiculopathy.    ROS   Per HPI; all other systems reviewed and are negative  Past History   Past Medical History:  Diagnosis Date   Hyperlipidemia    Hypertension    History reviewed. No pertinent surgical history. Family  History  Problem Relation Age of Onset   Hypertension Mother    Hyperlipidemia Mother    Stroke Father    Diabetes Father    Social History   Socioeconomic History   Marital status: Married    Spouse name: Annitta Needs   Number of children: 4   Years of education: 15   Highest education level: Some college, no degree  Occupational History   Not on file  Tobacco Use   Smoking status: Every Day    Packs/day: 0.50    Years: 25.00    Pack years: 12.50    Types: Cigarettes   Smokeless tobacco: Never  Vaping Use   Vaping Use: Never used  Substance and Sexual Activity   Alcohol use: Yes    Alcohol/week: 4.0 standard drinks    Types: 4 Glasses of wine per week   Drug use: Yes   Sexual activity: Yes    Partners: Female  Other Topics Concern   Not on file  Social History Narrative   Not on file   Social Determinants of Health   Financial Resource Strain: Not on file  Food Insecurity: Not on file  Transportation Needs: Not on file  Physical Activity: Not on file  Stress: Not on file  Social Connections: Not on file   No Known Allergies  Medications   Medications Prior to Admission  Medication Sig Dispense Refill Last Dose   aspirin 81 MG tablet Take 81 mg by mouth daily.   12/24/2020 at 0900   gabapentin (  NEURONTIN) 100 MG capsule One tab PO qHS for a week, then BID for a week, then TID. 90 capsule 11 12/23/2020 at 2200   lisinopril (ZESTRIL) 2.5 MG tablet TAKE 1 TABLET(2.5 MG) BY MOUTH DAILY 90 tablet 3 12/24/2020 at 0900   meloxicam (MOBIC) 15 MG tablet Take 1 tablet (15 mg total) by mouth daily. 30 tablet 0 12/24/2020 at 0900   rosuvastatin (CRESTOR) 5 MG tablet Take 1 tablet (5 mg total) by mouth at bedtime. 90 tablet 3 12/23/2020 at 2200   tiZANidine (ZANAFLEX) 4 MG tablet Take 0.5-1.5 tablets (2-6 mg total) by mouth every 8 (eight) hours as needed for muscle spasms (muscle tightness). 90 tablet 2 unknown at prn     Vitals   Vitals:   12/25/20 0305 12/25/20  0600 12/25/20 0959 12/25/20 1529  BP: 120/75 101/67 108/82 128/85  Pulse: 93 67 81 80  Resp: 16 16 18 18   Temp:  98.7 F (37.1 C) (!) 97.5 F (36.4 C) (!) 97.5 F (36.4 C)  TempSrc:  Oral Oral Oral  SpO2: 97% 91% 98% 95%  Weight:   111.1 kg   Height:   5\' 10"  (1.778 m)      Body mass index is 35.14 kg/m.  Physical Exam   Physical Exam Gen: A&O x4, NAD HEENT: Atraumatic, normocephalic;mucous membranes moist; oropharynx clear, tongue without atrophy or fasciculations. Neck: Supple, trachea midline. Resp: CTAB, no w/r/r CV: RRR, no m/g/r; nml S1 and S2. 2+ symmetric peripheral pulses. Abd: soft/NT/ND; nabs x 4 quad Extrem: Nml bulk; no cyanosis, clubbing, or edema.  Neuro: *MS: A&O x4. Follows multi-step commands.  *Speech: fluid, nondysarthric, able to name and repeat *CN:    I: Deferred   II,III: PERRLA, R APD, VFF by confrontation bilaterally, improved from yesterday after starting solumedrol, although visual acuity still significantly decreased on the R   III,IV,VI: EOMI w/o nystagmus, no ptosis   V: Sensation intact from V1 to V3 to LT   VII: Eyelid closure was full.  Smile symmetric.   VIII: Hearing intact to voice   IX,X: Voice normal, palate elevates symmetrically    XI: SCM/trap 5/5 bilat   XII: Tongue protrudes midline, no atrophy or fasciculations   *Motor:   Normal bulk.  No tremor, rigidity or bradykinesia. No pronator drift.    Strength: Dlt Bic Tri WrE WrF FgS Gr HF KnF KnE PlF DoF    Left 5 5 5 5 5 5 5  4+ 5 4+ 4+ 5    Right 5 5 5 5 5 5 5 5 5 5 5 5     *Sensory: Intact to light touch, pinprick, temperature vibration throughout. Symmetric. Propioception intact bilat.  No double-simultaneous extinction.  *Coordination:  Finger-to-nose, heel-to-shin, rapid alternating motions were intact. *Reflexes:  2+ and symmetric throughout without clonus; toes down-going bilat *Gait: deferred   Labs   CBC:  Recent Labs  Lab 12/24/20 1415 12/25/20 0713  WBC  7.4 8.3  NEUTROABS 4.2  --   HGB 16.6 16.1  HCT 49.1 50.4  MCV 93.7 92.6  PLT 306 333    Basic Metabolic Panel:  Lab Results  Component Value Date   NA 139 12/25/2020   K 5.0 12/25/2020   CO2 25 12/25/2020   GLUCOSE 158 (H) 12/25/2020   BUN 19 12/25/2020   CREATININE 1.18 12/25/2020   CALCIUM 9.3 12/25/2020   GFRNONAA >60 12/25/2020   GFRAA 69 05/18/2020   Lipid Panel:  Lab Results  Component Value Date  LDLCALC 130 (H) 11/15/2020   HgbA1c:  Lab Results  Component Value Date   HGBA1C 4.9 11/15/2020   Urine Drug Screen: No results found for: LABOPIA, COCAINSCRNUR, LABBENZ, AMPHETMU, THCU, LABBARB  Alcohol Level No results found for: ETH   Impression   This is a 50 year old gentleman with past medical history significant for hypertension, hyperlipidemia, lumbar radiculopathy, no prior known history of demyelinating disease who presented to [facility] ED on December 24, 2020 at the recommendation of his ophthalmologist due to painless vision loss in his right eye for the past week.  MRI brain and orbits wwo showed acute R optic neuritis as well as numerous T2/FLAIR hyperintensities in the bilateral cerebral hemispheres, a few also with minimal enhancement, with an appearance characteristic of demyelinating disease. As such he meets criteria for multiple sclerosis (multiple demyelinating lesions separated in time and space). He and I had a long talk about the dx and tx options this AM.  Recommendations   - Continue 1g solumedrol daily for planned 5 day course; planned end date 9/20 - MRI c and t spine wwo contrast - JC virus PCR pending - Vit D level pending - Amb referral to outpatient neurology after discharge to establish care and begin disease modifying medication.   Will continue to follow ______________________________________________________________________   Thank you for the opportunity to take part in the care of this patient. If you have any further questions,  please contact the neurology consultation attending.  Signed,  Bing Neighbors, MD [facility] [phone]  If 7pm- 7am, please page neurology on call as listed in AMION.

## 2020-12-25 NOTE — TOC CM/SW Note (Signed)
CSW completed chart review.  No TOC needs identified. Please place TOC consult if needs arise.  Carnell Beavers, LCSW [phone]  

## 2020-12-25 NOTE — Plan of Care (Signed)
Continuing with plan of care. 

## 2020-12-25 NOTE — Progress Notes (Signed)
[facility] Progress Note  Patient: Bill Perez    KVQ:[id number]  DOA: 12/24/2020     Date of Service: the patient was seen and examined on 12/25/2020  Chief Complaint  Patient presents with   Loss of Vision   Brief hospital course: Bill Perez is a 50 y.o. male with medical history significant for HTN, HLD, obesity who presented to [facility] ED at the recommendation of his ophthalmologist due to painless [name] vision loss of 1 week duration.  He first noticed while watching a football game and driving at night last Friday, a week ago.  He had no photophobia or phonophobia.  No prior similar symptoms.  He went to see his ophthalmologist on the day of presentation.  Eye exam revealed optic neuritis.  He was asked to come to the ED for further evaluation.  In the ED MRI brain with and without contrast as well as MRI of orbit without and with contrast showed right optic neuritis/perineuritis.  Appears suspicious for primary demyelinating disease most likely multiple sclerosis with areas of active demyelination.  Neurology was consulted by EDP and recommended IV Solu-Medrol 1000 mg x 1 dose.  Neurology will see in consultation in the morning.  TR[facility] was asked to admit.   ED Course: Temperature 98.7.  BP 147/93, pulse 72, respiration rate 16, O2 saturation 97% on room air.   Assessment and Plan: Present on Admission:  MS (multiple sclerosis) (HCC)   Active Problems:   MS (multiple sclerosis) (HCC)   Right optic neuritis/perineuritis with concern for primary demyelinating disease most likely multiple sclerosis, seen on MRI brain. Presented with right eye vision loss x1 week.  Was sent from ophthalmologist office after eye exam showed optic neuritis. MRI brain results as stated above. MRI cervical spine with and without contrast/MRI thoracic spine with and without contrast pending. Neurology consulted Recommended 1 g IV Solu-Medrol x 5 days Continue Fall precautions   Hypertension BP  stable Hold off lisinopril due to mild hyperkalemia Start Norvasc 5 mg daily.   Mild hyperkalemia Potassium 5.0 Monitor for now Hold off lisinopril.   Hyperlipidemia Resume home Crestor   Steroid-induced hyperglycemia Insulin sliding scale F/u  hemoglobin A1c  Tobacco use disorder Ongoing tobacco use with Smokes 15 cigarettes/day since the age of 11. Nicotine patch    Body mass index is 35.14 kg/m.  Interventions:    Diet: Heart healthy carb modified DVT Prophylaxis: Subcutaneous Lovenox   Advance goals of care discussion: Full code  Family Communication: family was not present at bedside, at the time of interview.  The pt provided permission to discuss medical plan with the family. Opportunity was given to ask question and all questions were answered satisfactorily.   Disposition:  Pt is from Home, admitted with right optic neuritis for MS, needs IV Solu-Medrol for 5 days till 12/28/20, which precludes a safe discharge. Discharge to home, when 5-day course of IV Solu-Medrol will be completed and cleared by neurology.  Most likely on 9/20  Subjective: No significant overnight events, patient was admitted due to right optic neuritis, patient received 1 dose of Solu-Medrol and he feels 10 to 15% improvement in his vision.  Denies any headache or dizziness, no any other new neurological findings.  Physical Exam: General:  alert oriented to time, place, and person.  Appear in no distress, affect appropriate Eyes: PERRLA ENT: Oral Mucosa Clear, moist  Neck: no JVD,  Cardiovascular: S1 and S2 Present, no Murmur,  Respiratory: good respiratory effort,  Bilateral Air entry equal and Decreased, no Crackles, no wheezes Abdomen: Bowel Sound present, Soft and no tenderness,  Skin: no rashes Extremities: no Pedal edema, no calf tenderness Neurologic: without any new focal findings Gait not checked due to patient safety concerns  Vitals:   12/25/20 0305 12/25/20 0600 12/25/20  0959 12/25/20 1529  BP: 120/75 101/67 108/82 128/85  Pulse: 93 67 81 80  Resp: 16 16 18 18   Temp:  98.7 F (37.1 C) (!) 97.5 F (36.4 C) (!) 97.5 F (36.4 C)  TempSrc:  Oral Oral Oral  SpO2: 97% 91% 98% 95%  Weight:   111.1 kg   Height:   5\' 10"  (1.778 m)    No intake or output data in the 24 hours ending 12/25/20 1602 Filed Weights   12/25/20 0959  Weight: 111.1 kg    Data Reviewed: I have personally reviewed and interpreted daily labs, tele strips, imagings as discussed above. I reviewed all nursing notes, pharmacy notes, vitals, pertinent old records I have discussed plan of care as described above with RN and patient/family.  CBC: Recent Labs  Lab 12/24/20 1415 12/25/20 0713  WBC 7.4 8.3  NEUTROABS 4.2  --   HGB 16.6 16.1  HCT 49.1 50.4  MCV 93.7 92.6  PLT 306 333   Basic Metabolic Panel: Recent Labs  Lab 12/24/20 1415 12/25/20 0713  NA 138 139  K 5.0 5.0  CL 104 106  CO2 27 25  GLUCOSE 121* 158*  BUN 15 19  CREATININE 1.19 1.18  CALCIUM 8.7* 9.3  MG  --  1.9    Studies: MR BRAIN W WO CONTRAST  Result Date: 12/24/2020 CLINICAL DATA:  Abnormal right vision EXAM: MRI HEAD AND ORBITS WITHOUT AND WITH CONTRAST TECHNIQUE: Multiplanar, multiecho pulse sequences of the brain and surrounding structures were obtained without and with intravenous contrast. Multiplanar, multiecho pulse sequences of the orbits and surrounding structures were obtained including fat saturation techniques, before and after intravenous contrast administration. CONTRAST:  79mL GADAVIST GADOBUTROL 1 MMOL/ML IV SOLN COMPARISON:  None. FINDINGS: MRI HEAD FINDINGS Brain: There is no acute infarction. There are multiple foci of T2 hyperintensity in the periventricular greater than subcortical white matter. Involvement of the corpus callosum and juxtacortical white matter is noted. Minimal involvement of the cerebellum is noted. A few foci of corresponding enhancement are identified (for example  series 18, images seventy, 95, 103, 110, 122, 126). Prominence of the ventricles and sulci reflects mild generalized parenchymal volume loss greater than expected for age. Some of the above lesions demonstrate marked T1 hypointensity centrally. There is no intracranial mass or mass effect. No hydrocephalus or extra-axial collection. Vascular: Major vessel flow voids at the skull base are preserved. Skull and upper cervical spine: Marrow signal is within normal limits. Other: Mastoid air cells are clear.  Sella is unremarkable. MRI ORBITS FINDINGS Orbits: No proptosis. No intraorbital mass. Globes, extraocular muscles, and lacrimal glands are symmetric and unremarkable. There is abnormal enhancement primarily about but also of the intraorbital right optic nerve. Increased T2 signal is seen within the intraorbital right optic nerve. Visualized sinuses: Unremarkable. Soft tissues: Unremarkable. IMPRESSION: Right optic neuritis/perineuritis. Appearance suspicious for primary demyelinating disease (most likely multiple sclerosis) with areas of active demyelination. Electronically Signed   By: 12/26/2020 M.D.   On: 12/24/2020 17:32   MR ORBITS W WO CONTRAST  Result Date: 12/24/2020 CLINICAL DATA:  Abnormal right vision EXAM: MRI HEAD AND ORBITS WITHOUT AND WITH CONTRAST TECHNIQUE: Multiplanar, multiecho  pulse sequences of the brain and surrounding structures were obtained without and with intravenous contrast. Multiplanar, multiecho pulse sequences of the orbits and surrounding structures were obtained including fat saturation techniques, before and after intravenous contrast administration. CONTRAST:  71mL GADAVIST GADOBUTROL 1 MMOL/ML IV SOLN COMPARISON:  None. FINDINGS: MRI HEAD FINDINGS Brain: There is no acute infarction. There are multiple foci of T2 hyperintensity in the periventricular greater than subcortical white matter. Involvement of the corpus callosum and juxtacortical white matter is noted. Minimal  involvement of the cerebellum is noted. A few foci of corresponding enhancement are identified (for example series 18, images seventy, 95, 103, 110, 122, 126). Prominence of the ventricles and sulci reflects mild generalized parenchymal volume loss greater than expected for age. Some of the above lesions demonstrate marked T1 hypointensity centrally. There is no intracranial mass or mass effect. No hydrocephalus or extra-axial collection. Vascular: Major vessel flow voids at the skull base are preserved. Skull and upper cervical spine: Marrow signal is within normal limits. Other: Mastoid air cells are clear.  Sella is unremarkable. MRI ORBITS FINDINGS Orbits: No proptosis. No intraorbital mass. Globes, extraocular muscles, and lacrimal glands are symmetric and unremarkable. There is abnormal enhancement primarily about but also of the intraorbital right optic nerve. Increased T2 signal is seen within the intraorbital right optic nerve. Visualized sinuses: Unremarkable. Soft tissues: Unremarkable. IMPRESSION: Right optic neuritis/perineuritis. Appearance suspicious for primary demyelinating disease (most likely multiple sclerosis) with areas of active demyelination. Electronically Signed   By: Guadlupe Spanish M.D.   On: 12/24/2020 17:32    Scheduled Meds:  amLODipine  5 mg Oral Daily   enoxaparin (LOVENOX) injection  40 mg Subcutaneous Q24H   gabapentin  100 mg Oral TID   insulin aspart  0-15 Units Subcutaneous TID WC   insulin aspart  0-5 Units Subcutaneous QHS   nicotine  14 mg Transdermal Daily   rosuvastatin  5 mg Oral QHS   Continuous Infusions:  methylPREDNISolone (SOLU-MEDROL) injection 1,000 mg (12/25/20 1324)   PRN Meds: acetaminophen, melatonin, ondansetron (ZOFRAN) IV, polyethylene glycol  Time spent: 35 minutes  Author: Gillis Santa. MD [facility] 12/25/2020 4:02 PM  To reach On-call, see care teams to locate the attending and reach out to them via [email or web address]. If 7PM-7AM,  please contact night-coverage If you still have difficulty reaching the attending provider, please page the [facility] (Director on Call) for [facility] on amion for assistance.

## 2020-12-26 DIAGNOSIS — G35 Multiple sclerosis: Secondary | ICD-10-CM | POA: Diagnosis not present

## 2020-12-26 DIAGNOSIS — H469 Unspecified optic neuritis: Secondary | ICD-10-CM | POA: Diagnosis not present

## 2020-12-26 LAB — BASIC METABOLIC PANEL
Anion gap: 4 — ABNORMAL LOW (ref 5–15)
BUN: 18 mg/dL (ref 6–20)
CO2: 28 mmol/L (ref 22–32)
Calcium: 8.6 mg/dL — ABNORMAL LOW (ref 8.9–10.3)
Chloride: 107 mmol/L (ref 98–111)
Creatinine, Ser: 1.05 mg/dL (ref 0.61–1.24)
GFR, Estimated: >60 mL/min (ref >60)
Glucose, Bld: 166 mg/dL — ABNORMAL HIGH (ref 70–99)
Potassium: 4.4 mmol/L (ref 3.5–5.1)
Sodium: 139 mmol/L (ref 135–145)

## 2020-12-26 LAB — GLUCOSE, CAPILLARY
Glucose-Capillary: 136 mg/dL — ABNORMAL HIGH (ref 70–99)
Glucose-Capillary: 183 mg/dL — ABNORMAL HIGH (ref 70–99)
Glucose-Capillary: 175 mg/dL — ABNORMAL HIGH (ref 70–99)
Glucose-Capillary: 121 mg/dL — ABNORMAL HIGH (ref 70–99)

## 2020-12-26 LAB — MAGNESIUM: Magnesium: 1.9 mg/dL (ref 1.7–2.4)

## 2020-12-26 LAB — CBC
HCT: 46.8 % (ref 39.0–52.0)
Hemoglobin: 15.1 g/dL (ref 13.0–17.0)
MCH: 29.5 pg (ref 26.0–34.0)
MCHC: 32.3 g/dL (ref 30.0–36.0)
MCV: 91.6 fL (ref 80.0–100.0)
Platelets: 329 10*3/uL (ref 150–400)
RBC: 5.11 MIL/uL (ref 4.22–5.81)
RDW: 12.5 % (ref 11.5–15.5)
WBC: 23.9 10*3/uL — ABNORMAL HIGH (ref 4.0–10.5)
nRBC: 0 % (ref 0.0–0.2)

## 2020-12-26 LAB — HEPATITIS PANEL, ACUTE
HCV Ab: NONREACTIVE
Hep A IgM: NONREACTIVE
Hep B C IgM: NONREACTIVE
Hepatitis B Surface Ag: NONREACTIVE

## 2020-12-26 LAB — PHOSPHORUS: Phosphorus: 3 mg/dL (ref 2.5–4.6)

## 2020-12-26 LAB — HEMOGLOBIN A1C
Hgb A1c MFr Bld: 4.8 % (ref 4.8–5.6)
Mean Plasma Glucose: 91.06 mg/dL

## 2020-12-26 MED ORDER — FOLIC ACID 1 MG PO TABS
1.0000 mg | ORAL_TABLET | Freq: Every day | ORAL | Status: DC
  Administered 2020-12-26 – 2020-12-28 (×3): 1 mg via ORAL
  Filled 2020-12-26 (×3): qty 1

## 2020-12-26 MED ORDER — VITAMIN B-12 1000 MCG PO TABS
500.0000 ug | ORAL_TABLET | Freq: Every day | ORAL | Status: DC
  Administered 2020-12-26 – 2020-12-28 (×3): 500 ug via ORAL
  Filled 2020-12-26 (×3): qty 1

## 2020-12-26 MED ORDER — VITAMIN D (ERGOCALCIFEROL) 1.25 MG (50000 UNIT) PO CAPS
50000.0000 [IU] | ORAL_CAPSULE | ORAL | Status: DC
Start: 2020-12-26 — End: 2020-12-29
  Administered 2020-12-26: 50000 [IU] via ORAL
  Filled 2020-12-26: qty 1

## 2020-12-26 NOTE — Progress Notes (Signed)
PT Cancellation Note  Patient Details Name: LENARDO WESTWOOD MRN: [id number] DOB: [birth year]   Cancelled Treatment:    Reason Eval/Treat Not Completed: PT screened, no needs identified, will sign off. Pt reporting Mod I with mobility. States he has been moving independently throughout his room with no new balance or gait deficits. Pt educated to contact RN if needs change. Pt reporting that currently he is at his baseline.    Devarious Pavek A Krisanne Lich 12/26/2020, 9:02 AM

## 2020-12-26 NOTE — Progress Notes (Signed)
Neurology Progress Note  Subjective: [name] vision loss continues to slowly improve on solumedrol. MRI c spine wwo contrast showed cervical spondylosis with multilevel moderate to severe neuroforaminal stenosis but no w/o demyelinating lesions in the cervical cord. MRI t spine wwo showed demyelinating lesions at T1, T8, and T10. The lesion at T1 enhances. There are no longitudinally extensive lesions. Patient initially describes his LLE weakness as chronic and related to an old football injury. Upon further questioning patient states it has only been present for 2 years, and did significantly worsen over the past 3 weeks. He was diagnosed by an outside physician with lumbar radiculopathy based on clinical presentation 3 weeks ago but no imaging was performed. He denies bladder retention.  Data  MRI c spine wwo contrast MRI t spine wwo contrast  1. Multiple T2 hyperintense lesions in the thoracic spinal cord consistent with demyelinating disease including an active lesion at T1. 2. No evidence of demyelinating disease in the cervical spine. 3. Cervical spondylosis with multilevel moderate to severe neural foraminal stenosis.  CNS imaging personally reviewed; I agree with above interpretation. Actual images of brain and t spine were reviewed with patient at bedside.    Exam: Vitals:   12/26/20 1530 12/26/20 2222  BP: 126/65 132/78  Pulse: 66 68  Resp: 18 18  Temp: 98.2 F (36.8 C) 97.9 F (36.6 C)  SpO2: 99% 98%    Physical Exam Gen: A&O x4, NAD HEENT: Atraumatic, normocephalic;mucous membranes moist; oropharynx clear, tongue without atrophy or fasciculations. Neck: Supple, trachea midline. Resp: CTAB, no w/r/r CV: RRR, no m/g/r; nml S1 and S2. 2+ symmetric peripheral pulses. Abd: soft/NT/ND; nabs x 4 quad Extrem: Nml bulk; no cyanosis, clubbing, or edema.   Neuro: *MS: A&O x4. Follows multi-step commands.  *Speech: fluid, nondysarthric, able to name and repeat *CN:    I:  Deferred   II,III: PERRLA, R APD, VFF by confrontation bilaterally, improved from yesterday after starting solumedrol, although visual acuity still significantly decreased on the R   III,IV,VI: EOMI w/o nystagmus, no ptosis   V: Sensation intact from V1 to V3 to LT   VII: Eyelid closure was full.  Smile symmetric.   VIII: Hearing intact to voice   IX,X: Voice normal, palate elevates symmetrically    XI: SCM/trap 5/5 bilat   XII: Tongue protrudes midline, no atrophy or fasciculations    *Motor:   Normal bulk.  No tremor, rigidity or bradykinesia. No pronator drift.     Strength: Dlt Bic Tri WrE WrF FgS Gr HF KnF KnE PlF DoF    Left 5 5 5 5 5 5 5  4+ 5 4+ 4+ 5    Right 5 5 5 5 5 5 5 5 5 5 5 5       *Sensory: Intact to light touch, pinprick, temperature vibration throughout. Symmetric. Propioception intact bilat.  No double-simultaneous extinction.  *Coordination:  Finger-to-nose, heel-to-shin, rapid alternating motions were intact. *Reflexes:  2+ and symmetric throughout without clonus; toes down-going bilat *Gait: deferred  Labs  CMP remarkable only for mild hypoglycemia CBC w/ reactive leukocytosis WBC 7.4 on admission -> 22.9 this AM; likely 2/2 solumedrol Vit D significantly low at 14.87  Impression: This is a 50 year old gentleman with past medical history significant for hypertension, hyperlipidemia, no prior known history of demyelinating disease who presented to [facility] ED on December 24, 2020 at the recommendation of his ophthalmologist due to painless vision loss in his right eye for the past week.  MRI brain  and orbits wwo showed acute R optic neuritis as well as numerous T2/FLAIR hyperintensities in the bilateral cerebral hemispheres, a few also with minimal enhancement, with an appearance characteristic of demyelinating disease. Spinal cord imaging showed 3 demyelinating lesions in the thoracic cord, one of which enhances. No demyelinating lesions in the cervical spine. He meets  criteria for multiple sclerosis (multiple demyelinating lesions separated in time and space). I did discuss his case by phone with Dr. Sharlyn Bologna MS subspecialist of [facility] over the weekend and have arranged for him to see her shortly after discharge to establish care and discuss options for disease-modifying therapy. He has highly active disease by imaging and voices preference for high efficacy therapy. He is likely someone who would benefit from tysabri if there is no contraindication.  Recommendations:  - Continue 1g solumedrol daily for planned 5 day course; planned end date 9/20 - vit D 50,000 IU weekly - F/u outstanding labs: JC virus, hepatitis panels, quantiferon gold - Referral made to Dr. Sharlyn Bologna at [facility] for close outpatient f/u   Will continue to follow  Bill Neighbors, MD [facility] [phone]  If 7pm- 7am, please page neurology on call as listed in AMION.

## 2020-12-26 NOTE — Progress Notes (Signed)
[facility] Progress Note  Patient: Bill Perez    QPR:[id number]  DOA: 12/24/2020     Date of Service: the patient was seen and examined on 12/26/2020  Chief Complaint  Patient presents with   Loss of Vision   Brief hospital course: Bill Perez is a 50 y.o. male with medical history significant for HTN, HLD, obesity who presented to [facility] ED at the recommendation of his ophthalmologist due to painless [name] vision loss of 1 week duration.  He first noticed while watching a football game and driving at night last Friday, a week ago.  He had no photophobia or phonophobia.  No prior similar symptoms.  He went to see his ophthalmologist on the day of presentation.  Eye exam revealed optic neuritis.  He was asked to come to the ED for further evaluation.  In the ED MRI brain with and without contrast as well as MRI of orbit without and with contrast showed right optic neuritis/perineuritis.  Appears suspicious for primary demyelinating disease most likely multiple sclerosis with areas of active demyelination.  Neurology was consulted by EDP and recommended IV Solu-Medrol 1000 mg x 1 dose.  Neurology will see in consultation in the morning.  [facility] was asked to admit.   ED Course: Temperature 98.7.  BP 147/93, pulse 72, respiration rate 16, O2 saturation 97% on room air.   Assessment and Plan: Present on Admission:  MS (multiple sclerosis) (HCC)   Active Problems:   MS (multiple sclerosis) (HCC)   Right optic neuritis/perineuritis with concern for primary demyelinating disease most likely multiple sclerosis, seen on MRI brain. Presented with right eye vision loss x1 week.  Was sent from ophthalmologist office after eye exam showed optic neuritis. MRI brain results as stated above. MRI C/T spine: 1. Multiple T2 hyperintense lesions in the thoracic spinal cord consistent with demyelinating disease including an active lesion at T1. 2. No evidence of demyelinating disease in the cervical  spine. 3. Cervical spondylosis with multilevel moderate to severe neural foraminal stenosis. Elevated WBC count secondary to steroids, continue to trend Neurology consulted Recommended 1 g IV Solu-Medrol x 5 days Continue Fall precautions   Hypertension BP stable Hold off lisinopril due to mild hyperkalemia Start Norvasc 5 mg daily.   Mild hyperkalemia Potassium 5.0--4.4 improved  Monitor for now Hold off lisinopril.   Hyperlipidemia Resume home Crestor   Steroid-induced hyperglycemia Insulin sliding scale hemoglobin A1c 4.8  Tobacco use disorder Ongoing tobacco use with Smokes 15 cigarettes/day since the age of 58. Nicotine patch   Folic acid deficiency, folic acid level 5.5, started folic acid 1 mg p.o. daily.  Follow with PCP to repeat folic acid level after 3 months.  Vitamin D deficiency, started vitamin D 50,000 units p.o. weekly.  Follow with PCP to repeat vitamin D level after 3 months. Vitamin B12 level 377, target >400, started vitamin B12 oral supplement.  Follow with PCP to repeat vitamin B12 level 3 months.   Body mass index is 35.14 kg/m.  Interventions:    Diet: Heart healthy carb modified DVT Prophylaxis: Subcutaneous Lovenox   Advance goals of care discussion: Full code  Family Communication: family was not present at bedside, at the time of interview.  The pt provided permission to discuss medical plan with the family. Opportunity was given to ask question and all questions were answered satisfactorily.   Disposition:  Pt is from Home, admitted with right optic neuritis for MS, needs IV Solu-Medrol for 5 days till  12/28/20, which precludes a safe discharge. Discharge to home, when 5-day course of IV Solu-Medrol will be completed and cleared by neurology.  Most likely on 9/20  Subjective: No significant overnight events, patient feels 20% improvement in the vision of right eye.  Patient feels that his steroids are working.  Denied any other active  issues.   Physical Exam: General:  alert oriented to time, place, and person.  Appear in no distress, affect appropriate Eyes: PERRLA ENT: Oral Mucosa Clear, moist  Neck: no JVD,  Cardiovascular: S1 and S2 Present, no Murmur,  Respiratory: good respiratory effort, Bilateral Air entry equal and Decreased, no Crackles, no wheezes Abdomen: Bowel Sound present, Soft and no tenderness,  Skin: no rashes Extremities: no Pedal edema, no calf tenderness Neurologic: without any new focal findings Gait not checked due to patient safety concerns  Vitals:   12/25/20 1529 12/25/20 1900 12/26/20 0436 12/26/20 0814  BP: 128/85 139/87 124/65 123/88  Pulse: 80 94 83 69  Resp: 18 16 18 18   Temp: (!) 97.5 F (36.4 C) 98.5 F (36.9 C) 97.8 F (36.6 C) 97.8 F (36.6 C)  TempSrc: Oral Oral Oral Oral  SpO2: 95% 97% 97% 100%  Weight:      Height:        Intake/Output Summary (Last 24 hours) at 12/26/2020 1314 Last data filed at 12/25/2020 1621 Gross per 24 hour  Intake 66 ml  Output --  Net 66 ml   Filed Weights   12/25/20 0959  Weight: 111.1 kg    Data Reviewed: I have personally reviewed and interpreted daily labs, tele strips, imagings as discussed above. I reviewed all nursing notes, pharmacy notes, vitals, pertinent old records I have discussed plan of care as described above with RN and patient/family.  CBC: Recent Labs  Lab 12/24/20 1415 12/25/20 0713 12/26/20 0532  WBC 7.4 8.3 23.9*  NEUTROABS 4.2  --   --   HGB 16.6 16.1 15.1  HCT 49.1 50.4 46.8  MCV 93.7 92.6 91.6  PLT 306 333 329   Basic Metabolic Panel: Recent Labs  Lab 12/24/20 1415 12/25/20 0713 12/26/20 0532  NA 138 139 139  K 5.0 5.0 4.4  CL 104 106 107  CO2 27 25 28   GLUCOSE 121* 158* 166*  BUN 15 19 18   CREATININE 1.19 1.18 1.05  CALCIUM 8.7* 9.3 8.6*  MG  --  1.9 1.9  PHOS  --   --  3.0    Studies: MR CERVICAL SPINE W WO CONTRAST  Result Date: 12/25/2020 CLINICAL DATA:  Myelopathy, acute or  progressive. Findings concerning for demyelinating disease on MRI of the brain and orbits. EXAM: MRI CERVICAL AND THORACIC SPINE WITHOUT AND WITH CONTRAST TECHNIQUE: Multiplanar and multiecho pulse sequences of the cervical spine, to include the craniocervical junction and cervicothoracic junction, and the thoracic spine, were obtained without and with intravenous contrast. CONTRAST:  77mL GADAVIST GADOBUTROL 1 MMOL/ML IV SOLN COMPARISON:  None. FINDINGS: MRI CERVICAL SPINE FINDINGS Alignment: Normal. Vertebrae: No fracture, suspicious marrow lesion, or significant marrow edema. Cord: Normal signal and morphology. No abnormal intradural enhancement. Posterior Fossa, vertebral arteries, paraspinal tissues: Unremarkable. Disc levels: Uncovertebral spurring results in severe left neural foraminal stenosis at C2-3, mild right and mild-to-moderate left neural foraminal stenosis at C3-4, moderate to severe bilateral neural foraminal stenosis at C4-5, and moderate right and mild-to-moderate left neural foraminal stenosis at C5-6. No spinal stenosis. MRI THORACIC SPINE FINDINGS Alignment:  Normal. Vertebrae: No fracture, suspicious marrow lesion, or  significant marrow edema. Cord: T2 hyperintense lesion in the right aspect of the cord at T1 extending over a craniocaudal distance of 1.7 cm with associated mild cord expansion and enhancement. Nonenhancing T2 hyperintense lesions in the cord at T8 and T10. Paraspinal and other soft tissues: 3.1 cm T2 hyperintense/cystic focus in the medial aspect of the spleen, benign in appearance. Disc levels: Right paracentral disc protrusions at T5-6, T6-7, and T7-8 and a central disc protrusion at T12-L1 without compressive stenosis. IMPRESSION: 1. Multiple T2 hyperintense lesions in the thoracic spinal cord consistent with demyelinating disease including an active lesion at T1. 2. No evidence of demyelinating disease in the cervical spine. 3. Cervical spondylosis with multilevel moderate  to severe neural foraminal stenosis. Electronically Signed   By: Sebastian Ache M.D.   On: 12/25/2020 18:48   MR THORACIC SPINE W WO CONTRAST  Result Date: 12/25/2020 CLINICAL DATA:  Myelopathy, acute or progressive. Findings concerning for demyelinating disease on MRI of the brain and orbits. EXAM: MRI CERVICAL AND THORACIC SPINE WITHOUT AND WITH CONTRAST TECHNIQUE: Multiplanar and multiecho pulse sequences of the cervical spine, to include the craniocervical junction and cervicothoracic junction, and the thoracic spine, were obtained without and with intravenous contrast. CONTRAST:  50mL GADAVIST GADOBUTROL 1 MMOL/ML IV SOLN COMPARISON:  None. FINDINGS: MRI CERVICAL SPINE FINDINGS Alignment: Normal. Vertebrae: No fracture, suspicious marrow lesion, or significant marrow edema. Cord: Normal signal and morphology. No abnormal intradural enhancement. Posterior Fossa, vertebral arteries, paraspinal tissues: Unremarkable. Disc levels: Uncovertebral spurring results in severe left neural foraminal stenosis at C2-3, mild right and mild-to-moderate left neural foraminal stenosis at C3-4, moderate to severe bilateral neural foraminal stenosis at C4-5, and moderate right and mild-to-moderate left neural foraminal stenosis at C5-6. No spinal stenosis. MRI THORACIC SPINE FINDINGS Alignment:  Normal. Vertebrae: No fracture, suspicious marrow lesion, or significant marrow edema. Cord: T2 hyperintense lesion in the right aspect of the cord at T1 extending over a craniocaudal distance of 1.7 cm with associated mild cord expansion and enhancement. Nonenhancing T2 hyperintense lesions in the cord at T8 and T10. Paraspinal and other soft tissues: 3.1 cm T2 hyperintense/cystic focus in the medial aspect of the spleen, benign in appearance. Disc levels: Right paracentral disc protrusions at T5-6, T6-7, and T7-8 and a central disc protrusion at T12-L1 without compressive stenosis. IMPRESSION: 1. Multiple T2 hyperintense lesions in  the thoracic spinal cord consistent with demyelinating disease including an active lesion at T1. 2. No evidence of demyelinating disease in the cervical spine. 3. Cervical spondylosis with multilevel moderate to severe neural foraminal stenosis. Electronically Signed   By: Sebastian Ache M.D.   On: 12/25/2020 18:48    Scheduled Meds:  amLODipine  5 mg Oral Daily   enoxaparin (LOVENOX) injection  40 mg Subcutaneous Q24H   folic acid  1 mg Oral Daily   gabapentin  100 mg Oral TID   insulin aspart  0-15 Units Subcutaneous TID WC   insulin aspart  0-5 Units Subcutaneous QHS   nicotine  14 mg Transdermal Daily   rosuvastatin  5 mg Oral QHS   vitamin B-12  500 mcg Oral Daily   Vitamin D (Ergocalciferol)  50,000 Units Oral Q7 days   Continuous Infusions:  methylPREDNISolone (SOLU-MEDROL) injection 1,000 mg (12/26/20 0936)   PRN Meds: acetaminophen, melatonin, ondansetron (ZOFRAN) IV, polyethylene glycol  Time spent: 35 minutes  Author: Gillis Santa. MD [facility] 12/26/2020 1:14 PM  To reach On-call, see care teams to locate the attending and reach  out to them via [email or web address]. If 7PM-7AM, please contact night-coverage If you still have difficulty reaching the attending provider, please page the [facility] (Director on Call) for [facility] on amion for assistance.

## 2020-12-27 DIAGNOSIS — G35 Multiple sclerosis: Secondary | ICD-10-CM | POA: Diagnosis not present

## 2020-12-27 LAB — GLUCOSE, CAPILLARY
Glucose-Capillary: 120 mg/dL — ABNORMAL HIGH (ref 70–99)
Glucose-Capillary: 164 mg/dL — ABNORMAL HIGH (ref 70–99)
Glucose-Capillary: 188 mg/dL — ABNORMAL HIGH (ref 70–99)
Glucose-Capillary: 239 mg/dL — ABNORMAL HIGH (ref 70–99)

## 2020-12-27 LAB — CBC
HCT: 47.5 % (ref 39.0–52.0)
Hemoglobin: 15.7 g/dL (ref 13.0–17.0)
MCH: 30.1 pg (ref 26.0–34.0)
MCHC: 33.1 g/dL (ref 30.0–36.0)
MCV: 91.2 fL (ref 80.0–100.0)
Platelets: 302 10*3/uL (ref 150–400)
RBC: 5.21 MIL/uL (ref 4.22–5.81)
RDW: 12.5 % (ref 11.5–15.5)
WBC: 22.9 10*3/uL — ABNORMAL HIGH (ref 4.0–10.5)
nRBC: 0 % (ref 0.0–0.2)

## 2020-12-27 LAB — BASIC METABOLIC PANEL
Anion gap: 7 (ref 5–15)
BUN: 21 mg/dL — ABNORMAL HIGH (ref 6–20)
CO2: 28 mmol/L (ref 22–32)
Calcium: 9.1 mg/dL (ref 8.9–10.3)
Chloride: 104 mmol/L (ref 98–111)
Creatinine, Ser: 1.11 mg/dL (ref 0.61–1.24)
GFR, Estimated: >60 mL/min (ref >60)
Glucose, Bld: 133 mg/dL — ABNORMAL HIGH (ref 70–99)
Potassium: 4.6 mmol/L (ref 3.5–5.1)
Sodium: 139 mmol/L (ref 135–145)

## 2020-12-27 MED ORDER — SODIUM CHLORIDE 0.9 % IV SOLN
1000.0000 mg | Freq: Every day | INTRAVENOUS | Status: AC
  Administered 2020-12-27: 1000 mg via INTRAVENOUS
  Filled 2020-12-27: qty 16

## 2020-12-27 MED ORDER — SODIUM CHLORIDE 0.9 % IV SOLN
1000.0000 mg | Freq: Two times a day (BID) | INTRAVENOUS | Status: DC
  Filled 2020-12-27: qty 16

## 2020-12-27 NOTE — Progress Notes (Signed)
Dr Tyson Babinski notified that the IVPB dose of solu-medrol infiltrated

## 2020-12-27 NOTE — Progress Notes (Addendum)
[facility] Progress Note  Patient: Bill Perez    WUJ:[id number]  DOA: 12/24/2020      Brief narrative :  Bill Perez is a 50 y.o. male with past medical history of hypertension, hyperlipidemia, obesity presented to the hospital with complaints of painless right eye vision loss for 1 week.  In the ED, brain MRI was performed with right optic neuritis perineuritis.  Suspicious for primary demyelinating disease.  Neurology was consulted and patient was started on high-dose IV Solu-Medrol.  Patient was then admitted hospital for further evaluation and treatment.  Assessment and Plan:  Active Problems:   MS (multiple sclerosis) (HCC)    Right optic neuritis/perineuritis with concern for primary demyelinating disease most likely multiple sclerosis, seen on MRI brain. Patient was sent from ophthalmologist office.  Elevated WBC count due to steroids.  Continue Solu-Medrol 1 g IV daily for 5 days.  Follow neurology recommendation.  OT recommends outpatient OT..  Will get PT evaluation.  Essential hypertension On Norvasc 5 mg daily.  Had hyperkalemia so lisinopril was on hold.  Mild hyperkalemia Potassium improved.  Lisinopril on hold.  Will resume when blood pressure is elevated.   Hyperlipidemia Continue Crestor   Steroid-induced hyperglycemia Latest hemoglobin A1c of 4.8.  Continue sliding scale insulin,diabetic diet. Accu-Cheks.  Adjust POC glucose of 120.  Tobacco use disorder Ongoing, smokes 15 cigarettes a day.  On nicotine patch but   Folic acid deficiency, continue folic acid daily for 3 months.  Folic acid level 5.5. Follow with PCP to repeat levels in 3 months.   Vitamin D deficiency, started vitamin D 50,000 units p.o. weekly.  Follow-up vitamin D levels in 3 months.  Vitamin B12 level 377, target >400, continue vitamin B12.  Obesity.   Would benefit from continued weight loss as outpatient.  DVT Prophylaxis: Subcutaneous Lovenox   Advance goals of care  discussion: Full code  Family Communication:  None  Disposition:  Home  Subjective:  Today, patient was seen and examined at bedside.  Feels that his vision has started to improve.  No other issues   Physical Exam: General:  Average built, not in obvious distress alert awake and oriented. HENT:   No scleral pallor or icterus noted. Oral mucosa is moist.  Chest:  Clear breath sounds.  Diminished breath sounds bilaterally. No crackles or wheezes.  CVS: S1 &S2 heard. No murmur.  Regular rate and rhythm. Abdomen: Soft, nontender, nondistended.  Bowel sounds are heard.   Extremities: No cyanosis, clubbing or edema.  Peripheral pulses are palpable. Psych: Alert, awake and oriented, normal mood CNS: Power equal in all extremities.  Decreased vision on the right side. Skin: Warm and dry.  No rashes noted.   Vitals:   12/26/20 0814 12/26/20 1530 12/26/20 2222 12/27/20 0758  BP: 123/88 126/65 132/78 125/86  Pulse: 69 66 68 67  Resp: 18 18 18 18   Temp: 97.8 F (36.6 C) 98.2 F (36.8 C) 97.9 F (36.6 C) (!) 97.4 F (36.3 C)  TempSrc: Oral Oral Oral Oral  SpO2: 100% 99% 98% 99%  Weight:      Height:        Intake/Output Summary (Last 24 hours) at 12/27/2020 1025 Last data filed at 12/27/2020 1007 Gross per 24 hour  Intake 180 ml  Output --  Net 180 ml    Filed Weights   12/25/20 0959  Weight: 111.1 kg    Data Reviewed: I have personally reviewed and interpreted daily labs, tele strips, imagings  as discussed above. I reviewed all nursing notes, pharmacy notes, vitals, pertinent old records I have discussed plan of care as described above with RN and patient/family.  CBC: Recent Labs  Lab 12/24/20 1415 12/25/20 0713 12/26/20 0532 12/27/20 0539  WBC 7.4 8.3 23.9* 22.9*  NEUTROABS 4.2  --   --   --   HGB 16.6 16.1 15.1 15.7  HCT 49.1 50.4 46.8 47.5  MCV 93.7 92.6 91.6 91.2  PLT 306 333 329 302    Basic Metabolic Panel: Recent Labs  Lab 12/24/20 1415  12/25/20 0713 12/26/20 0532 12/27/20 0539  NA 138 139 139 139  K 5.0 5.0 4.4 4.6  CL 104 106 107 104  CO2 27 25 28 28   GLUCOSE 121* 158* 166* 133*  BUN 15 19 18  21*  CREATININE 1.19 1.18 1.05 1.11  CALCIUM 8.7* 9.3 8.6* 9.1  MG  --  1.9 1.9  --   PHOS  --   --  3.0  --      Studies: No results found.  Scheduled Meds:  amLODipine  5 mg Oral Daily   enoxaparin (LOVENOX) injection  40 mg Subcutaneous Q24H   folic acid  1 mg Oral Daily   gabapentin  100 mg Oral TID   insulin aspart  0-15 Units Subcutaneous TID WC   insulin aspart  0-5 Units Subcutaneous QHS   nicotine  14 mg Transdermal Daily   rosuvastatin  5 mg Oral QHS   vitamin B-12  500 mcg Oral Daily   Vitamin D (Ergocalciferol)  50,000 Units Oral Q7 days   Continuous Infusions:  methylPREDNISolone (SOLU-MEDROL) injection 1,000 mg (12/27/20 0848)   PRN Meds: acetaminophen, melatonin, ondansetron (ZOFRAN) IV, polyethylene glycol   , MD [facility] 12/27/2020 10:25 AM

## 2020-12-28 DIAGNOSIS — H469 Unspecified optic neuritis: Secondary | ICD-10-CM | POA: Diagnosis not present

## 2020-12-28 DIAGNOSIS — G35 Multiple sclerosis: Secondary | ICD-10-CM | POA: Diagnosis not present

## 2020-12-28 LAB — BASIC METABOLIC PANEL
Anion gap: 6 (ref 5–15)
BUN: 21 mg/dL — ABNORMAL HIGH (ref 6–20)
CO2: 27 mmol/L (ref 22–32)
Calcium: 8.6 mg/dL — ABNORMAL LOW (ref 8.9–10.3)
Chloride: 106 mmol/L (ref 98–111)
Creatinine, Ser: 1.18 mg/dL (ref 0.61–1.24)
GFR, Estimated: >60 mL/min (ref >60)
Glucose, Bld: 150 mg/dL — ABNORMAL HIGH (ref 70–99)
Potassium: 4.1 mmol/L (ref 3.5–5.1)
Sodium: 139 mmol/L (ref 135–145)

## 2020-12-28 LAB — CBC
HCT: 48.1 % (ref 39.0–52.0)
Hemoglobin: 16.3 g/dL (ref 13.0–17.0)
MCH: 31.2 pg (ref 26.0–34.0)
MCHC: 33.9 g/dL (ref 30.0–36.0)
MCV: 92 fL (ref 80.0–100.0)
Platelets: 335 10*3/uL (ref 150–400)
RBC: 5.23 MIL/uL (ref 4.22–5.81)
RDW: 12.1 % (ref 11.5–15.5)
WBC: 19.3 10*3/uL — ABNORMAL HIGH (ref 4.0–10.5)
nRBC: 0 % (ref 0.0–0.2)

## 2020-12-28 LAB — GLUCOSE, CAPILLARY
Glucose-Capillary: 167 mg/dL — ABNORMAL HIGH (ref 70–99)
Glucose-Capillary: 202 mg/dL — ABNORMAL HIGH (ref 70–99)
Glucose-Capillary: 110 mg/dL — ABNORMAL HIGH (ref 70–99)

## 2020-12-28 MED ORDER — FOLIC ACID 1 MG PO TABS: 1.0000 mg | ORAL_TABLET | Freq: Every day | ORAL | 2 refills | Status: DC

## 2020-12-28 MED ORDER — SODIUM CHLORIDE 0.9 % IV SOLN
1000.0000 mg | Freq: Every day | INTRAVENOUS | Status: AC
  Administered 2020-12-28: 1000 mg via INTRAVENOUS
  Filled 2020-12-28: qty 16

## 2020-12-28 MED ORDER — CYANOCOBALAMIN 500 MCG PO TABS: 500.0000 ug | ORAL_TABLET | Freq: Every day | ORAL | 2 refills | Status: AC

## 2020-12-28 MED ORDER — SODIUM CHLORIDE 0.9 % IV SOLN
1000.0000 mg | Freq: Every day | INTRAVENOUS | Status: DC
  Filled 2020-12-28: qty 16

## 2020-12-28 MED ORDER — MELATONIN 5 MG PO TABS
2.5000 mg | ORAL_TABLET | Freq: Every evening | ORAL | 0 refills | Status: DC | PRN
Start: 2020-12-28 — End: 2021-07-04

## 2020-12-28 MED ORDER — VITAMIN D (ERGOCALCIFEROL) 1.25 MG (50000 UNIT) PO CAPS: 50000.0000 [IU] | ORAL_CAPSULE | ORAL | 0 refills | Status: DC

## 2020-12-28 MED ORDER — NICOTINE 14 MG/24HR TD PT24: 14.0000 mg | MEDICATED_PATCH | Freq: Every day | TRANSDERMAL | 0 refills | Status: DC

## 2020-12-28 NOTE — Progress Notes (Signed)
Patient discharged home via wheelchair, no acute distress noted.  Patient discharged with all pertinent information, prescription, and discharge instructions. Patient able to teach back instructions. IV site d/ced. Care relinquished.

## 2020-12-28 NOTE — Discharge Summary (Signed)
Physician Discharge Summary  Bill Perez:[id number] DOB: 1970/10/19 DOA: 12/24/2020  PCP: Danelle Berry, PA-C  Admit date: 12/24/2020 Discharge date: 12/28/2020  Admitted From: Home  Discharge disposition: Home  Recommendations for Outpatient Follow-Up:   Follow up with your primary care provider in one week.  Check CBC, BMP, magnesium in the next visit Follow-up with [facility] neurology as has been scheduled for discussion about definitive treatment of multiple sclerosis. F/u outstanding labs: JC virus, hepatitis panels, quantiferon gold  Discharge Diagnosis:   Active Problems:   MS (multiple sclerosis) (HCC)  Discharge Condition: Improved.  Diet recommendation: Low sodium, heart healthy.    Wound care: None.  Code status: Full.   History of Present Illness:   Bill Perez is a 50 y.o. male with past medical history of hypertension, hyperlipidemia, obesity presented to the hospital with complaints of painless right eye vision loss for 1 week.  In the ED, brain MRI was performed with right optic neuritis/perineuritis.  Suspicious for primary demyelinating disease.  Neurology was consulted and patient was started on high-dose IV Solu-Medrol.  Patient was then admitted hospital for further evaluation and treatment.  Hospital Course:   Following conditions were addressed during hospitalization as listed below,  Right optic neuritis/perineuritis with concern for primary demyelinating disease most likely multiple sclerosis, seen on MRI brain. Patient was sent from ophthalmologist office.  Elevated WBC count due to steroids.  Patient received Solu-Medrol 1 g IV daily for 5 days.  Patient will follow-up with [facility] neurology as outpatient and appointment has already been made for further treatment plans.  MRI of the cervical and thoracic spine showed there are multiple T2 hyperintense lesions in the thoracic spinal cord lesion including active lesion at T1.   Essential  hypertension Continue lisinopril    Mild hyperkalemia Improved.  Hyperlipidemia Continue Crestor   Steroid-induced hyperglycemia Latest hemoglobin A1c of 4.8.  Improved   Tobacco use disorder Ongoing, smokes 15 cigarettes a day.  Counseled, will prescribe nicotine patch on discharge.   Folic acid deficiency, continue folic acid daily for 3 months.  Folic acid level 5.5. Follow with PCP to repeat levels in 3 months.    Vitamin D deficiency, started vitamin D 50,000 units p.o. weekly.  Follow-up vitamin D levels in 3 months.   Vitamin B12 level 377, target >400, continue vitamin B12 on discharge..   Obesity.   Would benefit from continued weight loss as outpatient.   Disposition.  At this time, patient is stable for disposition home with outpatient PCP and neurology follow-up.  Medical Consultants:   Neurology  Procedures:   MRI of the brain, cervical and thoracic spine  Subjective:   Today, patient was seen and examined at bedside.  He states that he has mild blurring of the right eye vision but improving  Discharge Exam:   Vitals:   12/28/20 0554 12/28/20 0752  BP: 132/86 (!) 130/91  Pulse: 71 65  Resp: 16 17  Temp: 98.4 F (36.9 C) 97.9 F (36.6 C)  SpO2: 96% 100%   Vitals:   12/27/20 1523 12/27/20 2120 12/28/20 0554 12/28/20 0752  BP: 123/83 140/90 132/86 (!) 130/91  Pulse: 70 68 71 65  Resp: Temp: 97.7 F (36.5 C) 97.7 F (36.5 C) 98.4 F (36.9 C) 97.9 F (36.6 C)  TempSrc: Oral Oral Oral Oral  SpO2: 99% 99% 96% 100%  Weight:      Height:       General: Alert  awake, not in obvious distress HENT: pupils equally reacting to light,  No scleral pallor or icterus noted. Oral mucosa is moist.  Chest:  Clear breath sounds.  Diminished breath sounds bilaterally. No crackles or wheezes.  CVS: S1 &S2 heard. No murmur.  Regular rate and rhythm. Abdomen: Soft, nontender, nondistended.  Bowel sounds are heard.   Extremities: No cyanosis,  clubbing or edema.  Peripheral pulses are palpable. Psych: Alert, awake and oriented, normal mood CNS:.  Blurred vision on the right eye, power equal in all extremities.   Skin: Warm and dry.  No rashes noted.  The results of significant diagnostics from this hospitalization (including imaging, microbiology, ancillary and laboratory) are listed below for reference.     Diagnostic Studies:   MR BRAIN W WO CONTRAST  Result Date: 12/24/2020 CLINICAL DATA:  Abnormal right vision EXAM: MRI HEAD AND ORBITS WITHOUT AND WITH CONTRAST TECHNIQUE: Multiplanar, multiecho pulse sequences of the brain and surrounding structures were obtained without and with intravenous contrast. Multiplanar, multiecho pulse sequences of the orbits and surrounding structures were obtained including fat saturation techniques, before and after intravenous contrast administration. CONTRAST:  28mL GADAVIST GADOBUTROL 1 MMOL/ML IV SOLN COMPARISON:  None. FINDINGS: MRI HEAD FINDINGS Brain: There is no acute infarction. There are multiple foci of T2 hyperintensity in the periventricular greater than subcortical white matter. Involvement of the corpus callosum and juxtacortical white matter is noted. Minimal involvement of the cerebellum is noted. A few foci of corresponding enhancement are identified (for example series 18, images seventy, 95, 103, 110, 122, 126). Prominence of the ventricles and sulci reflects mild generalized parenchymal volume loss greater than expected for age. Some of the above lesions demonstrate marked T1 hypointensity centrally. There is no intracranial mass or mass effect. No hydrocephalus or extra-axial collection. Vascular: Major vessel flow voids at the skull base are preserved. Skull and upper cervical spine: Marrow signal is within normal limits. Other: Mastoid air cells are clear.  Sella is unremarkable. MRI ORBITS FINDINGS Orbits: No proptosis. No intraorbital mass. Globes, extraocular muscles, and lacrimal  glands are symmetric and unremarkable. There is abnormal enhancement primarily about but also of the intraorbital right optic nerve. Increased T2 signal is seen within the intraorbital right optic nerve. Visualized sinuses: Unremarkable. Soft tissues: Unremarkable. IMPRESSION: Right optic neuritis/perineuritis. Appearance suspicious for primary demyelinating disease (most likely multiple sclerosis) with areas of active demyelination. Electronically Signed   By: Guadlupe Spanish M.D.   On: 12/24/2020 17:32   MR CERVICAL SPINE W WO CONTRAST  Result Date: 12/25/2020 CLINICAL DATA:  Myelopathy, acute or progressive. Findings concerning for demyelinating disease on MRI of the brain and orbits. EXAM: MRI CERVICAL AND THORACIC SPINE WITHOUT AND WITH CONTRAST TECHNIQUE: Multiplanar and multiecho pulse sequences of the cervical spine, to include the craniocervical junction and cervicothoracic junction, and the thoracic spine, were obtained without and with intravenous contrast. CONTRAST:  66mL GADAVIST GADOBUTROL 1 MMOL/ML IV SOLN COMPARISON:  None. FINDINGS: MRI CERVICAL SPINE FINDINGS Alignment: Normal. Vertebrae: No fracture, suspicious marrow lesion, or significant marrow edema. Cord: Normal signal and morphology. No abnormal intradural enhancement. Posterior Fossa, vertebral arteries, paraspinal tissues: Unremarkable. Disc levels: Uncovertebral spurring results in severe left neural foraminal stenosis at C2-3, mild right and mild-to-moderate left neural foraminal stenosis at C3-4, moderate to severe bilateral neural foraminal stenosis at C4-5, and moderate right and mild-to-moderate left neural foraminal stenosis at C5-6. No spinal stenosis. MRI THORACIC SPINE FINDINGS Alignment:  Normal. Vertebrae: No fracture, suspicious marrow lesion, or significant  marrow edema. Cord: T2 hyperintense lesion in the right aspect of the cord at T1 extending over a craniocaudal distance of 1.7 cm with associated mild cord expansion  and enhancement. Nonenhancing T2 hyperintense lesions in the cord at T8 and T10. Paraspinal and other soft tissues: 3.1 cm T2 hyperintense/cystic focus in the medial aspect of the spleen, benign in appearance. Disc levels: Right paracentral disc protrusions at T5-6, T6-7, and T7-8 and a central disc protrusion at T12-L1 without compressive stenosis. IMPRESSION: 1. Multiple T2 hyperintense lesions in the thoracic spinal cord consistent with demyelinating disease including an active lesion at T1. 2. No evidence of demyelinating disease in the cervical spine. 3. Cervical spondylosis with multilevel moderate to severe neural foraminal stenosis. Electronically Signed   By: Sebastian Ache M.D.   On: 12/25/2020 18:48   MR THORACIC SPINE W WO CONTRAST  Result Date: 12/25/2020 CLINICAL DATA:  Myelopathy, acute or progressive. Findings concerning for demyelinating disease on MRI of the brain and orbits. EXAM: MRI CERVICAL AND THORACIC SPINE WITHOUT AND WITH CONTRAST TECHNIQUE: Multiplanar and multiecho pulse sequences of the cervical spine, to include the craniocervical junction and cervicothoracic junction, and the thoracic spine, were obtained without and with intravenous contrast. CONTRAST:  41mL GADAVIST GADOBUTROL 1 MMOL/ML IV SOLN COMPARISON:  None. FINDINGS: MRI CERVICAL SPINE FINDINGS Alignment: Normal. Vertebrae: No fracture, suspicious marrow lesion, or significant marrow edema. Cord: Normal signal and morphology. No abnormal intradural enhancement. Posterior Fossa, vertebral arteries, paraspinal tissues: Unremarkable. Disc levels: Uncovertebral spurring results in severe left neural foraminal stenosis at C2-3, mild right and mild-to-moderate left neural foraminal stenosis at C3-4, moderate to severe bilateral neural foraminal stenosis at C4-5, and moderate right and mild-to-moderate left neural foraminal stenosis at C5-6. No spinal stenosis. MRI THORACIC SPINE FINDINGS Alignment:  Normal. Vertebrae: No fracture,  suspicious marrow lesion, or significant marrow edema. Cord: T2 hyperintense lesion in the right aspect of the cord at T1 extending over a craniocaudal distance of 1.7 cm with associated mild cord expansion and enhancement. Nonenhancing T2 hyperintense lesions in the cord at T8 and T10. Paraspinal and other soft tissues: 3.1 cm T2 hyperintense/cystic focus in the medial aspect of the spleen, benign in appearance. Disc levels: Right paracentral disc protrusions at T5-6, T6-7, and T7-8 and a central disc protrusion at T12-L1 without compressive stenosis. IMPRESSION: 1. Multiple T2 hyperintense lesions in the thoracic spinal cord consistent with demyelinating disease including an active lesion at T1. 2. No evidence of demyelinating disease in the cervical spine. 3. Cervical spondylosis with multilevel moderate to severe neural foraminal stenosis. Electronically Signed   By: Sebastian Ache M.D.   On: 12/25/2020 18:48   MR ORBITS W WO CONTRAST  Result Date: 12/24/2020 CLINICAL DATA:  Abnormal right vision EXAM: MRI HEAD AND ORBITS WITHOUT AND WITH CONTRAST TECHNIQUE: Multiplanar, multiecho pulse sequences of the brain and surrounding structures were obtained without and with intravenous contrast. Multiplanar, multiecho pulse sequences of the orbits and surrounding structures were obtained including fat saturation techniques, before and after intravenous contrast administration. CONTRAST:  31mL GADAVIST GADOBUTROL 1 MMOL/ML IV SOLN COMPARISON:  None. FINDINGS: MRI HEAD FINDINGS Brain: There is no acute infarction. There are multiple foci of T2 hyperintensity in the periventricular greater than subcortical white matter. Involvement of the corpus callosum and juxtacortical white matter is noted. Minimal involvement of the cerebellum is noted. A few foci of corresponding enhancement are identified (for example series 18, images seventy, 95, 103, 110, 122, 126). Prominence of the ventricles and sulci  reflects mild  generalized parenchymal volume loss greater than expected for age. Some of the above lesions demonstrate marked T1 hypointensity centrally. There is no intracranial mass or mass effect. No hydrocephalus or extra-axial collection. Vascular: Major vessel flow voids at the skull base are preserved. Skull and upper cervical spine: Marrow signal is within normal limits. Other: Mastoid air cells are clear.  Sella is unremarkable. MRI ORBITS FINDINGS Orbits: No proptosis. No intraorbital mass. Globes, extraocular muscles, and lacrimal glands are symmetric and unremarkable. There is abnormal enhancement primarily about but also of the intraorbital right optic nerve. Increased T2 signal is seen within the intraorbital right optic nerve. Visualized sinuses: Unremarkable. Soft tissues: Unremarkable. IMPRESSION: Right optic neuritis/perineuritis. Appearance suspicious for primary demyelinating disease (most likely multiple sclerosis) with areas of active demyelination. Electronically Signed   By: Guadlupe Spanish M.D.   On: 12/24/2020 17:32     Labs:   Basic Metabolic Panel: Recent Labs  Lab 12/24/20 1415 12/25/20 0713 12/26/20 0532 12/27/20 0539 12/28/20 0425  NA 138 139 139 139 139  K 5.0 5.0 4.4 4.6 4.1  CL 104 106 107 104 106  CO2 27 25 28 28 27   GLUCOSE 121* 158* 166* 133* 150*  BUN 15 19 18  21* 21*  CREATININE 1.19 1.18 1.05 1.11 1.18  CALCIUM 8.7* 9.3 8.6* 9.1 8.6*  MG  --  1.9 1.9  --   --   PHOS  --   --  3.0  --   --    GFR Estimated Creatinine Clearance: 93.4 mL/min (by C-G formula based on SCr of 1.18 mg/dL). Liver Function Tests: Recent Labs  Lab 12/25/20 0713  AST 23  ALT 26  ALKPHOS 80  BILITOT 1.1  PROT 7.9  ALBUMIN 4.0   No results for input(s): LIPASE, AMYLASE in the last 168 hours. No results for input(s): AMMONIA in the last 168 hours. Coagulation profile No results for input(s): INR, PROTIME in the last 168 hours.  CBC: Recent Labs  Lab 12/24/20 1415  12/25/20 0713 12/26/20 0532 12/27/20 0539 12/28/20 0425  WBC 7.4 8.3 23.9* 22.9* 19.3*  NEUTROABS 4.2  --   --   --   --   HGB 16.6 16.1 15.1 15.7 16.3  HCT 49.1 50.4 46.8 47.5 48.1  MCV 93.7 92.6 91.6 91.2 92.0  PLT 306 333 329 302 335   Cardiac Enzymes: No results for input(s): CKTOTAL, CKMB, CKMBINDEX, TROPONINI in the last 168 hours. BNP: Invalid input(s): POCBNP CBG: Recent Labs  Lab 12/27/20 1136 12/27/20 1630 12/27/20 2123 12/28/20 0751 12/28/20 1201  GLUCAP 164* 188* 239* 167* 202*   D-Dimer No results for input(s): DDIMER in the last 72 hours. Hgb A1c Recent Labs    12/26/20 0532  HGBA1C 4.8   Lipid Profile No results for input(s): CHOL, HDL, LDLCALC, TRIG, CHOLHDL, LDLDIRECT in the last 72 hours. Thyroid function studies No results for input(s): TSH, T4TOTAL, T3FREE, THYROIDAB in the last 72 hours.  Invalid input(s): FREET3 Anemia work up No results for input(s): VITAMINB12, FOLATE, FERRITIN, TIBC, IRON, RETICCTPCT in the last 72 hours. Microbiology Recent Results (from the past 240 hour(s))  Resp Panel by RT-PCR (Flu A&B, Covid) Nasopharyngeal Swab     Status: None   Collection Time: 12/25/20 12:50 AM   Specimen: Nasopharyngeal Swab; Nasopharyngeal(NP) swabs in vial transport medium  Result Value Ref Range Status   SARS Coronavirus 2 by RT PCR NEGATIVE NEGATIVE Final    Comment: (NOTE) SARS-CoV-2 target nucleic acids are NOT  DETECTED.  The SARS-CoV-2 RNA is generally detectable in upper respiratory specimens during the acute phase of infection. The lowest concentration of SARS-CoV-2 viral copies this assay can detect is 138 copies/mL. A negative result does not preclude SARS-Cov-2 infection and should not be used as the sole basis for treatment or other patient management decisions. A negative result may occur with  improper specimen collection/handling, submission of specimen other than nasopharyngeal swab, presence of viral mutation(s) within  the areas targeted by this assay, and inadequate number of viral copies(<138 copies/mL). A negative result must be combined with clinical observations, patient history, and epidemiological information. The expected result is Negative.  Fact Sheet for Patients:  BloggerCourse.com  Fact Sheet for Healthcare Providers:  SeriousBroker.it  This test is no t yet approved or cleared by the [place] FDA and  has been authorized for detection and/or diagnosis of SARS-CoV-2 by FDA under an Emergency Use Authorization (EUA). This EUA will remain  in effect (meaning this test can be used) for the duration of the COVID-19 declaration under Section 564(b)(1) of the Act, 21 U.S.C.section 360bbb-3(b)(1), unless the authorization is terminated  or revoked sooner.       Influenza A by PCR NEGATIVE NEGATIVE Final   Influenza B by PCR NEGATIVE NEGATIVE Final    Comment: (NOTE) The Xpert Xpress SARS-CoV-2/FLU/RSV plus assay is intended as an aid in the diagnosis of influenza from Nasopharyngeal swab specimens and should not be used as a sole basis for treatment. Nasal washings and aspirates are unacceptable for Xpert Xpress SARS-CoV-2/FLU/RSV testing.  Fact Sheet for Patients: BloggerCourse.com  Fact Sheet for Healthcare Providers: SeriousBroker.it  This test is not yet approved or cleared by the [place] FDA and has been authorized for detection and/or diagnosis of SARS-CoV-2 by FDA under an Emergency Use Authorization (EUA). This EUA will remain in effect (meaning this test can be used) for the duration of the COVID-19 declaration under Section 564(b)(1) of the Act, 21 U.S.C. section 360bbb-3(b)(1), unless the authorization is terminated or revoked.  Performed at [facility], [address], [place], [place] [address]      Discharge Instructions:   Discharge  Instructions     Ambulatory referral to Neurology   Complete by: As directed    Appt requested within 2 weeks   Diet - low sodium heart healthy   Complete by: As directed    Discharge instructions   Complete by: As directed    Follow-up with your primary care provider in 1 week.  Follow-up with [facility] neurology on the day that you have been scheduled.  Continue to take vitamin D folic acid on discharge.  Seek medical attention for worsening symptoms.  No driving until seen by your ophthalmologist/neurologist.   Increase activity slowly   Complete by: As directed       Allergies as of 12/28/2020   No Known Allergies      Medication List     TAKE these medications    aspirin 81 MG tablet Take 81 mg by mouth daily.   folic acid 1 MG tablet Commonly known as: FOLVITE Take 1 tablet (1 mg total) by mouth daily.   gabapentin 100 MG capsule Commonly known as: NEURONTIN One tab PO qHS for a week, then BID for a week, then TID.   lisinopril 2.5 MG tablet Commonly known as: ZESTRIL TAKE 1 TABLET(2.5 MG) BY MOUTH DAILY   melatonin 5 MG Tabs Take 0.5 tablets (2.5 mg total) by mouth at bedtime  as needed.   meloxicam 15 MG tablet Commonly known as: MOBIC Take 1 tablet (15 mg total) by mouth daily.   nicotine 14 mg/24hr patch Commonly known as: NICODERM CQ - dosed in mg/24 hours Place 1 patch (14 mg total) onto the skin daily.   rosuvastatin 5 MG tablet Commonly known as: Crestor Take 1 tablet (5 mg total) by mouth at bedtime.   tiZANidine 4 MG tablet Commonly known as: Zanaflex Take 0.5-1.5 tablets (2-6 mg total) by mouth every 8 (eight) hours as needed for muscle spasms (muscle tightness).   vitamin B-12 500 MCG tablet Commonly known as: CYANOCOBALAMIN Take 1 tablet (500 mcg total) by mouth daily.   Vitamin D (Ergocalciferol) 1.25 MG (50000 UNIT) Caps capsule Commonly known as: DRISDOL Take 1 capsule (50,000 Units total) by mouth every 7 (seven) days. Start taking  on: January 02, 2021        Follow-up Information     Danelle Berry, PA-C Follow up in 1 week(s).   Specialty: Family Medicine Contact information: [address] [place] [address] [phone]                 Time coordinating discharge: 39 minutes  Signed:  Varie Machamer  [facility] 12/28/2020, 2:53 PM

## 2020-12-28 NOTE — Progress Notes (Signed)
Mobility Specialist - Progress Note   12/28/20 1400  Mobility  Activity Ambulated in room  Level of Assistance Independent  Assistive Device None  Distance Ambulated (ft) 50 ft  Mobility Ambulated independently in room  Mobility Response Tolerated well  Mobility performed by Mobility specialist  $Mobility charge 1 Mobility    Pt ambulating in room independently. Reports feeling at/near baseline, no complaints. Pt anticipating d/c later this date.    Filiberto Pinks Mobility Specialist 12/28/20, 2:55 PM

## 2020-12-28 NOTE — Progress Notes (Signed)
Subjective: Continued improvement of vision OD. States that his vision is now back to 65% of baseline. When he came in to the hospital, patient states his right eye vision was at 0%.  Objective: Current vital signs: BP (!) 130/91 (BP Location: Left Arm)   Pulse 65   Temp 97.9 F (36.6 C) (Oral)   Resp 17   Ht 5\' 10"  (1.778 m)   Wt 111.1 kg   SpO2 100%   BMI 35.14 kg/m  Vital signs in last 24 hours: Temp:  [97.7 F (36.5 C)-98.4 F (36.9 C)] 97.9 F (36.6 C) (09/20 0752) Pulse Rate:  [65-71] 65 (09/20 0752) Resp:  [16-18] 17 (09/20 0752) BP: (123-140)/(83-91) 130/91 (09/20 0752) SpO2:  [96 %-100 %] 100 % (09/20 0752)  Intake/Output from previous day: 09/19 0701 - 09/20 0700 In: 426 [P.O.:360; IV Piggyback:66] Out: -  Intake/Output this shift: No intake/output data recorded. Nutritional status:  Diet Order             Diet heart healthy/carb modified Room service appropriate? Yes; Fluid consistency: Thin  Diet effective now                  HEENT: [place]/AT Lungs: Respirations unlabored Ext: No edema  Neurologic Exam: Mental Status: Alert, fully oriented, thought content appropriate.  Bright affect. Speech fluent without evidence of aphasia.  Able to follow all commands without difficulty. Cranial Nerves: II:  Visual acuity OD is to finger counting and object recognition. Can correctly read only one of the two numbers of the first line of the [place] pocket vision screener (visual acuity OD is worse than 20/800). Visual acuity OS is 20/30. RAPD on the right.  III,IV, VI: EOMI. No ptosis. No nystagmus.  V,VII: Smile symmetric, facial temp sensation equal bilaterally VIII: hearing intact to voice IX,X: No hypophonia XI: Symmetric shoulder shrug XII: Midline tongue extension  Motor: RUE 5/5 RLE 5/5 LUE 5/5 LLE 4/5 proximally and distally Sensory: Temp and FT sensation is intact x 4 Deep Tendon Reflexes:  1+ bilateral brachioradialis 1+ bilateral  patellae Plantars: Right: downgoing   Left: downgoing Cerebellar: No ataxia with FNF bilaterally  Gait: Deferred   Lab Results: Results for orders placed or performed during the hospital encounter of 12/24/20 (from the past 48 hour(s))  Glucose, capillary     Status: Abnormal   Collection Time: 12/26/20  4:57 PM  Result Value Ref Range   Glucose-Capillary 175 (H) 70 - 99 mg/dL    Comment: Glucose reference range applies only to samples taken after fasting for at least 8 hours.  Glucose, capillary     Status: Abnormal   Collection Time: 12/26/20 10:12 PM  Result Value Ref Range   Glucose-Capillary 121 (H) 70 - 99 mg/dL    Comment: Glucose reference range applies only to samples taken after fasting for at least 8 hours.  CBC     Status: Abnormal   Collection Time: 12/27/20  5:39 AM  Result Value Ref Range   WBC 22.9 (H) 4.0 - 10.5 K/uL   RBC 5.21 4.22 - 5.81 MIL/uL   Hemoglobin 15.7 13.0 - 17.0 g/dL   HCT 12/29/20 58.5 - 27.7 %   MCV 91.2 80.0 - 100.0 fL   MCH 30.1 26.0 - 34.0 pg   MCHC 33.1 30.0 - 36.0 g/dL   RDW 82.4 23.5 - 36.1 %   Platelets 302 150 - 400 K/uL   nRBC 0.0 0.0 - 0.2 %    Comment: Performed at  [facility], [address] [place], [place] [address]  Basic metabolic panel     Status: Abnormal   Collection Time: 12/27/20  5:39 AM  Result Value Ref Range   Sodium 139 135 - 145 mmol/L   Potassium 4.6 3.5 - 5.1 mmol/L    Comment: HEMOLYSIS AT THIS LEVEL MAY AFFECT RESULT   Chloride 104 98 - 111 mmol/L   CO2 28 22 - 32 mmol/L   Glucose, Bld 133 (H) 70 - 99 mg/dL    Comment: Glucose reference range applies only to samples taken after fasting for at least 8 hours.   BUN 21 (H) 6 - 20 mg/dL   Creatinine, Ser 5.28 0.61 - 1.24 mg/dL   Calcium 9.1 8.9 - 41.3 mg/dL   GFR, Estimated >24 >40 mL/min    Comment: (NOTE) Calculated using the CKD-EPI Creatinine Equation (2021)    Anion gap 7 5 - 15    Comment: Performed at [facility], [address] [place], [place] [address]  Glucose, capillary     Status: Abnormal   Collection Time: 12/27/20  7:59 AM  Result Value Ref Range   Glucose-Capillary 120 (H) 70 - 99 mg/dL    Comment: Glucose reference range applies only to samples taken after fasting for at least 8 hours.  Glucose, capillary     Status: Abnormal   Collection Time: 12/27/20 11:36 AM  Result Value Ref Range   Glucose-Capillary 164 (H) 70 - 99 mg/dL    Comment: Glucose reference range applies only to samples taken after fasting for at least 8 hours.  Glucose, capillary     Status: Abnormal   Collection Time: 12/27/20  4:30 PM  Result Value Ref Range   Glucose-Capillary 188 (H) 70 - 99 mg/dL    Comment: Glucose reference range applies only to samples taken after fasting for at least 8 hours.  Glucose, capillary     Status: Abnormal   Collection Time: 12/27/20  9:23 PM  Result Value Ref Range   Glucose-Capillary 239 (H) 70 - 99 mg/dL    Comment: Glucose reference range applies only to samples taken after fasting for at least 8 hours.  CBC     Status: Abnormal   Collection Time: 12/28/20  4:25 AM  Result Value Ref Range   WBC 19.3 (H) 4.0 - 10.5 K/uL   RBC 5.23 4.22 - 5.81 MIL/uL   Hemoglobin 16.3 13.0 - 17.0 g/dL   HCT 53.6 64.4 - 03.4 %   MCV 92.0 80.0 - 100.0 fL   MCH 31.2 26.0 - 34.0 pg   MCHC 33.9 30.0 - 36.0 g/dL   RDW 74.2 59.5 - 63.8 %   Platelets 335 150 - 400 K/uL   nRBC 0.0 0.0 - 0.2 %    Comment: Performed at [facility], [address] [place], [place] [address]  Basic metabolic panel     Status: Abnormal   Collection Time: 12/28/20  4:25 AM  Result Value Ref Range   Sodium 139 135 - 145 mmol/L   Potassium 4.1 3.5 - 5.1 mmol/L   Chloride 106 98 - 111 mmol/L   CO2 27 22 - 32 mmol/L   Glucose, Bld 150 (H) 70 - 99 mg/dL    Comment: Glucose reference range applies only to samples taken after fasting for at least 8 hours.   BUN 21 (H) 6 - 20 mg/dL   Creatinine, Ser 3.29 0.61 - 1.24 mg/dL    Calcium 8.6 (  L) 8.9 - 10.3 mg/dL   GFR, Estimated >06 >30 mL/min    Comment: (NOTE) Calculated using the CKD-EPI Creatinine Equation (2021)    Anion gap 6 5 - 15    Comment: Performed at [facility], [address] [place], [place] [address]  Glucose, capillary     Status: Abnormal   Collection Time: 12/28/20  7:51 AM  Result Value Ref Range   Glucose-Capillary 167 (H) 70 - 99 mg/dL    Comment: Glucose reference range applies only to samples taken after fasting for at least 8 hours.   Comment 1 Notify RN   Glucose, capillary     Status: Abnormal   Collection Time: 12/28/20 12:01 PM  Result Value Ref Range   Glucose-Capillary 202 (H) 70 - 99 mg/dL    Comment: Glucose reference range applies only to samples taken after fasting for at least 8 hours.   Comment 1 Notify RN     Recent Results (from the past 240 hour(s))  Resp Panel by RT-PCR (Flu A&B, Covid) Nasopharyngeal Swab     Status: None   Collection Time: 12/25/20 12:50 AM   Specimen: Nasopharyngeal Swab; Nasopharyngeal(NP) swabs in vial transport medium  Result Value Ref Range Status   SARS Coronavirus 2 by RT PCR NEGATIVE NEGATIVE Final    Comment: (NOTE) SARS-CoV-2 target nucleic acids are NOT DETECTED.  The SARS-CoV-2 RNA is generally detectable in upper respiratory specimens during the acute phase of infection. The lowest concentration of SARS-CoV-2 viral copies this assay can detect is 138 copies/mL. A negative result does not preclude SARS-Cov-2 infection and should not be used as the sole basis for treatment or other patient management decisions. A negative result may occur with  improper specimen collection/handling, submission of specimen other than nasopharyngeal swab, presence of viral mutation(s) within the areas targeted by this assay, and inadequate number of viral copies(<138 copies/mL). A negative result must be combined with clinical observations, patient history, and  epidemiological information. The expected result is Negative.  Fact Sheet for Patients:  BloggerCourse.com  Fact Sheet for Healthcare Providers:  SeriousBroker.it  This test is no t yet approved or cleared by the [place] FDA and  has been authorized for detection and/or diagnosis of SARS-CoV-2 by FDA under an Emergency Use Authorization (EUA). This EUA will remain  in effect (meaning this test can be used) for the duration of the COVID-19 declaration under Section 564(b)(1) of the Act, 21 U.S.C.section 360bbb-3(b)(1), unless the authorization is terminated  or revoked sooner.       Influenza A by PCR NEGATIVE NEGATIVE Final   Influenza B by PCR NEGATIVE NEGATIVE Final    Comment: (NOTE) The Xpert Xpress SARS-CoV-2/FLU/RSV plus assay is intended as an aid in the diagnosis of influenza from Nasopharyngeal swab specimens and should not be used as a sole basis for treatment. Nasal washings and aspirates are unacceptable for Xpert Xpress SARS-CoV-2/FLU/RSV testing.  Fact Sheet for Patients: BloggerCourse.com  Fact Sheet for Healthcare Providers: SeriousBroker.it  This test is not yet approved or cleared by the [place] FDA and has been authorized for detection and/or diagnosis of SARS-CoV-2 by FDA under an Emergency Use Authorization (EUA). This EUA will remain in effect (meaning this test can be used) for the duration of the COVID-19 declaration under Section 564(b)(1) of the Act, 21 U.S.C. section 360bbb-3(b)(1), unless the authorization is terminated or revoked.  Performed at [facility], [address] [place], [place] [address]     Lipid  Panel No results for input(s): CHOL, TRIG, HDL, CHOLHDL, VLDL, LDLCALC in the last 72 hours.  Studies/Results: No results found.  Medications: Scheduled:  amLODipine  5 mg Oral Daily   enoxaparin (LOVENOX)  injection  40 mg Subcutaneous Q24H   folic acid  1 mg Oral Daily   gabapentin  100 mg Oral TID   insulin aspart  0-15 Units Subcutaneous TID WC   insulin aspart  0-5 Units Subcutaneous QHS   nicotine  14 mg Transdermal Daily   rosuvastatin  5 mg Oral QHS   vitamin B-12  500 mcg Oral Daily   Vitamin D (Ergocalciferol)  50,000 Units Oral Q7 days   Assessment: This is a 50 year old gentleman with past medical history significant for hypertension, hyperlipidemia, no prior known history of demyelinating disease who presented to [facility] ED on December 24, 2020 at the recommendation of his ophthalmologist due to painless vision loss in his right eye for the past week.  MRI brain and orbits wwo showed acute R optic neuritis as well as numerous T2/FLAIR hyperintensities in the bilateral cerebral hemispheres, a few also with minimal enhancement, with an appearance characteristic of demyelinating disease. Spinal cord imaging showed 3 demyelinating lesions in the thoracic cord, one of which enhances. No demyelinating lesions in the cervical spine. He meets criteria for multiple sclerosis (multiple demyelinating lesions separated in time and space).  - Exam today reveals vision OD to object recognition and counting fingers, worse than 20/800 by objective measurement - Patient subjectively feels that his vision OD is improved to 65% of baseline.  - Today is day 5/5 of IVMP  Recommendations:  - Continue 1 g solumedrol daily for planned 5 day course; last dose to be given today - Vit D 50,000 IU weekly - F/u outstanding labs: JC virus, hepatitis panels, quantiferon gold - MRI personally reviewed. In my opinion the patient's MRI findings and clinical presentation are not severe enough to warrant Tysabri as initial trial of disease modifying therapy for his MS. Better alternatives may be Copaxone or Avonex. Discussed with the patient.  - Referral made to Dr. Sharlyn Bologna at [facility] for close outpatient  f/u   LOS: 4 days   @Electronically  signed: Dr. 12/28/2020  1:27 PM

## 2020-12-29 ENCOUNTER — Telehealth: Payer: Self-pay

## 2020-12-29 LAB — QUANTIFERON-TB GOLD PLUS (RQFGPL)
QuantiFERON Mitogen Value: >10 IU/mL
QuantiFERON Nil Value: 0 IU/mL
QuantiFERON TB1 Ag Value: 0.02 IU/mL
QuantiFERON TB2 Ag Value: 0.01 IU/mL

## 2020-12-29 LAB — JC VIRUS DNA,PCR (WHOLE BLOOD): JC Virus DNA, PCR, Blood: NEGATIVE

## 2020-12-29 LAB — QUANTIFERON-TB GOLD PLUS: QuantiFERON-TB Gold Plus: NEGATIVE

## 2020-12-29 NOTE — Telephone Encounter (Signed)
Transition Care Management Follow-up Telephone Call Date of discharge and from where: 12/28/20 [facility] How have you been since you were released from the hospital? Pt states he is doing okay, resting Any questions or concerns? No  Items Reviewed: Did the pt receive and understand the discharge instructions provided? Yes  Medications obtained and verified? Yes  Other? No  Any new allergies since your discharge? No  Dietary orders reviewed? Yes Do you have support at home? Yes   Home Care and Equipment/Supplies: Were home health services ordered? no Were any new equipment or medical supplies ordered?  No  Functional Questionnaire: (I = Independent and D = Dependent) ADLs: I  Bathing/Dressing- I  Meal Prep- I  Eating- I  Maintaining continence- I  Transferring/Ambulation- I  Managing Meds- I  Follow up appointments reviewed:  PCP Hospital f/u appt confirmed? Yes  Scheduled to see Henry Russel PAC on 01/10/21. Specialist Hospital f/u appt confirmed? Yes  Scheduled to see [facility] on 12/31/20. Are transportation arrangements needed? No  If their condition worsens, is the pt aware to call PCP or go to the Emergency Dept.? Yes Was the patient provided with contact information for the PCP's office or ED? Yes Was to pt encouraged to call back with questions or concerns? Yes

## 2020-12-30 LAB — MISC LABCORP TEST (SEND OUT): Labcorp test code: 144473

## 2020-12-30 LAB — HEPATITIS B VIRUS (PROFILE VI)

## 2021-01-03 ENCOUNTER — Encounter: Payer: Self-pay | Admitting: Family Medicine

## 2021-01-03 ENCOUNTER — Ambulatory Visit: Payer: No Typology Code available for payment source | Admitting: Family Medicine

## 2021-01-03 ENCOUNTER — Other Ambulatory Visit: Payer: Self-pay

## 2021-01-03 DIAGNOSIS — G5712 Meralgia paresthetica, left lower limb: Secondary | ICD-10-CM | POA: Diagnosis not present

## 2021-01-03 MED ORDER — GABAPENTIN 100 MG PO CAPS: ORAL_CAPSULE | ORAL | 11 refills | Status: DC

## 2021-01-03 NOTE — Patient Instructions (Signed)
-   Continue weekly increase of gabapntin - Stop meloxicam - Contact us if symptoms worsen after stopping meloxicam - Start physical therapy after you see neurology - Continue home exercises - Contact us after neurology workup and treatment complete

## 2021-01-03 NOTE — Progress Notes (Signed)
Primary Care / Sports Medicine Office Visit  Patient Information:  Patient ID: Bill Perez, male DOB: 01-Mar-1971 Age: 50 y.o. MRN: [id number]   Bill Perez is a pleasant 50 y.o. male presenting with the following:  Chief Complaint  Patient presents with   Follow-up    Meralgia paresthetica of left side, done some of the exercises, it helped some but still painful, pt thinks it was a pinched nerve    Review of Systems pertinent details above   Patient Active Problem List   Diagnosis Date Noted   MS (multiple sclerosis) (HCC) 12/24/2020   Meralgia paresthetica of left side 12/02/2020   Tobacco abuse counseling 10/29/2017   Class 2 severe obesity due to excess calories with serious comorbidity and body mass index (BMI) of 35.0 to 35.9 in adult (HCC) 10/29/2017   Muscle strain of thigh 03/14/2017   Dyslipidemia 06/27/2016   Abdominal wall strain 03/15/2016   Past Medical History:  Diagnosis Date   Hyperlipidemia    Hypertension    Outpatient Encounter Medications as of 01/03/2021  Medication Sig   aspirin 81 MG tablet Take 81 mg by mouth daily.   folic acid (FOLVITE) 1 MG tablet Take 1 tablet (1 mg total) by mouth daily.   lisinopril (ZESTRIL) 2.5 MG tablet TAKE 1 TABLET(2.5 MG) BY MOUTH DAILY   melatonin 5 MG TABS Take 0.5 tablets (2.5 mg total) by mouth at bedtime as needed.   meloxicam (MOBIC) 15 MG tablet Take 1 tablet (15 mg total) by mouth daily.   rosuvastatin (CRESTOR) 5 MG tablet Take 1 tablet (5 mg total) by mouth at bedtime.   tiZANidine (ZANAFLEX) 4 MG tablet Take 0.5-1.5 tablets (2-6 mg total) by mouth every 8 (eight) hours as needed for muscle spasms (muscle tightness).   vitamin B-12 (CYANOCOBALAMIN) 500 MCG tablet Take 1 tablet (500 mcg total) by mouth daily.   Vitamin D, Ergocalciferol, (DRISDOL) 1.25 MG (50000 UNIT) CAPS capsule Take 1 capsule (50,000 Units total) by mouth every 7 (seven) days.   [DISCONTINUED] gabapentin (NEURONTIN) 100 MG capsule  One tab PO qHS for a week, then BID for a week, then TID.   [DISCONTINUED] nicotine (NICODERM CQ - DOSED IN MG/24 HOURS) 14 mg/24hr patch Place 1 patch (14 mg total) onto the skin daily.   gabapentin (NEURONTIN) 100 MG capsule Three tabs PO qHS for a week, then four for a week, then five.   No facility-administered encounter medications on file as of 01/03/2021.   History reviewed. No pertinent surgical history.  Vitals:   01/03/21 0758  BP: 110/82  Pulse: 88  Temp: 98.4 F (36.9 C)   Vitals:   01/03/21 0758  Weight: 241 lb (109.3 kg)  Height: 5\' 10"  (1.778 m)   Body mass index is 34.58 kg/m.     Independent interpretation of notes and tests performed by another provider:   None  Procedures performed:   None  Pertinent History, Exam, Impression, and Recommendations:   Meralgia paresthetica of left side Patient presents for follow-up to left anterolateral thigh numbness.  Since last visit his medical history has been significant for right eye visual loss, hospital admission where imaging revealed concern for multiple sclerosis.  He was treated with IV steroids and has noted improvement without resolution of his eye symptoms.  His eye continues to improve.  He did note improvement in his left lower extremity symptoms during his hospital stay but, since discharge, has noted gradual recurrence.  Still feeling  improved compared to initial visit.  Examination is stable over interval visit.  Given his recent diagnosis of multiple sclerosis, this can be a possible etiology however as opposed to a relapsing and remitting course, he has had progressive worsening over 2-3 years.  Since discontinuation of steroids while admitted, symptoms have recurred.  I have discussed the same with the patient as well as next steps.  He will maintain follow-up with neurology for further evaluation management.  In regards to left anterolateral thigh, I have advised continued titration of gabapentin as he  is noting improvement from this, we will hold from meloxicam and if symptoms worsen, we can consider restart of this versus alternative NSAID agents.  Lastly, he will start formal physical therapy after he sees neurology.  He will continue home-based exercises over the interim as he also reports improvement from this.  We will coordinate a follow-up after he is seen neurology, he was advised to contact us to schedule this.  Pending symptoms at his return, can consider ultrasound-guided injection of the lateral femoral cutaneous nerve.   Orders & Medications Meds ordered this encounter  Medications   gabapentin (NEURONTIN) 100 MG capsule    Sig: Three tabs PO qHS for a week, then four for a week, then five.    Dispense:  90 capsule    Refill:  11   Orders Placed This Encounter  Procedures   Ambulatory referral to Physical Therapy     Return if symptoms worsen or fail to improve.     Jerrol Banana, MD   Primary Care Sports Medicine [facility]

## 2021-01-03 NOTE — Assessment & Plan Note (Signed)
Patient presents for follow-up to left anterolateral thigh numbness.  Since last visit his medical history has been significant for right eye visual loss, hospital admission where imaging revealed concern for multiple sclerosis.  He was treated with IV steroids and has noted improvement without resolution of his eye symptoms.  His eye continues to improve.  He did note improvement in his left lower extremity symptoms during his hospital stay but, since discharge, has noted gradual recurrence.  Still feeling improved compared to initial visit.  Examination is stable over interval visit.  Given his recent diagnosis of multiple sclerosis, this can be a possible etiology however as opposed to a relapsing and remitting course, he has had progressive worsening over 2-3 years.  Since discontinuation of steroids while admitted, symptoms have recurred.  I have discussed the same with the patient as well as next steps.  He will maintain follow-up with neurology for further evaluation management.  In regards to left anterolateral thigh, I have advised continued titration of gabapentin as he is noting improvement from this, we will hold from meloxicam and if symptoms worsen, we can consider restart of this versus alternative NSAID agents.  Lastly, he will start formal physical therapy after he sees neurology.  He will continue home-based exercises over the interim as he also reports improvement from this.  We will coordinate a follow-up after he is seen neurology, he was advised to contact us to schedule this.  Pending symptoms at his return, can consider ultrasound-guided injection of the lateral femoral cutaneous nerve.

## 2021-01-10 ENCOUNTER — Other Ambulatory Visit: Payer: Self-pay

## 2021-01-10 ENCOUNTER — Ambulatory Visit: Payer: No Typology Code available for payment source | Admitting: Nurse Practitioner

## 2021-01-10 ENCOUNTER — Encounter: Payer: Self-pay | Admitting: Nurse Practitioner

## 2021-01-10 VITALS — BP 124/86 | HR 100 | Temp 98.1°F | Resp 18 | Ht 70.0 in | Wt 241.3 lb

## 2021-01-10 DIAGNOSIS — G35 Multiple sclerosis: Secondary | ICD-10-CM | POA: Diagnosis not present

## 2021-01-10 DIAGNOSIS — Z09 Encounter for follow-up examination after completed treatment for conditions other than malignant neoplasm: Secondary | ICD-10-CM

## 2021-01-10 NOTE — Progress Notes (Signed)
BP 124/86   Pulse 100   Temp 98.1 F (36.7 C) (Oral)   Resp 18   Ht 5\' 10"  (1.778 m)   Wt 241 lb 4.8 oz (109.5 kg)   SpO2 98%   BMI 34.62 kg/m    Subjective:    Patient ID: Bill Perez, male    DOB: 1970-11-02, 50 y.o.   MRN: [id number]  HPI: Bill Perez is a 50 y.o. male, here alone  Chief Complaint  Patient presents with   Hospitalization Follow-up    Seen at [facility] Dx with Multiple Sclerosis. Was having right eye blurred vision. Now at 60%. Neurology appointment and eye appointment scheduled for October   Hospital Follow-up:  He was admitted to [facility] on 12/24/20 for blurred vision/loss of vision in his right eye.  He was diagnosed with Multiple Sclerosis.  MRI Brain/Orbits impression:  Right optic neuritis/perineuritis. Appearance suspicious for primary demyelinating disease (most likely multiple sclerosis) with areas of active demyelination.  MRI thoracic spine impression:  Multiple T2 hyperintense lesions in the thoracic spinal cord consistent with demyelinating disease including an active lesion at T1.  He says his vision has improved to about 60%.  He denies any numbness or tingling. He has an appointment coming up with Dr. 12/26/20 of [facility] neurology.  Discussed that his discharge paperwork recommended rechecking labs.  He says he just had his labs drawn in [facility] and will send [facility] a copy via mychart.  Will order labs and if they are not included in the labs drawn at [facility] he will need to have them checked.   Relevant past medical, surgical, family and social history reviewed and updated as indicated. Interim medical history since our last visit reviewed. Allergies and medications reviewed and updated.  Review of Systems  Constitutional: Negative for fever or weight change.  Respiratory: Negative for cough and shortness of breath.   Cardiovascular: Negative for chest pain or palpitations.  Gastrointestinal: Negative for abdominal pain, no bowel changes.   Musculoskeletal: Negative for gait problem or joint swelling.  Skin: Negative for rash.  Neurological: Negative for dizziness or headache. Positive for vision loss of right eye No other specific complaints in a complete review of systems (except as listed in HPI above).      Objective:    BP 124/86   Pulse 100   Temp 98.1 F (36.7 C) (Oral)   Resp 18   Ht 5\' 10"  (1.778 m)   Wt 241 lb 4.8 oz (109.5 kg)   SpO2 98%   BMI 34.62 kg/m   Wt Readings from Last 3 Encounters:  01/10/21 241 lb 4.8 oz (109.5 kg)  01/03/21 241 lb (109.3 kg)  12/25/20 244 lb 14.9 oz (111.1 kg)    Physical Exam  Constitutional: Patient appears well-developed and well-nourished. Obese, No distress.  HEENT: head atraumatic, normocephalic, pupils equal and reactive to light, neck supple Cardiovascular: Normal rate, regular rhythm and normal heart sounds.  No murmur heard. No BLE edema. Pulmonary/Chest: Effort normal and breath sounds normal. No respiratory distress. Abdominal: Soft.  There is no tenderness. Psychiatric: Patient has a normal mood and affect. behavior is normal. Judgment and thought content normal.   Results for orders placed or performed during the hospital encounter of 12/24/20  Resp Panel by RT-PCR (Flu A&B, Covid) Nasopharyngeal Swab   Specimen: Nasopharyngeal Swab; Nasopharyngeal(NP) swabs in vial transport medium  Result Value Ref Range   SARS Coronavirus 2 by RT PCR NEGATIVE NEGATIVE   Influenza  A by PCR NEGATIVE NEGATIVE   Influenza B by PCR NEGATIVE NEGATIVE  Sedimentation rate  Result Value Ref Range   Sed Rate 1 0 - 20 mm/hr  C-reactive protein  Result Value Ref Range   CRP <0.5 <1.0 mg/dL  CBC with Differential  Result Value Ref Range   WBC 7.4 4.0 - 10.5 K/uL   RBC 5.24 4.22 - 5.81 MIL/uL   Hemoglobin 16.6 13.0 - 17.0 g/dL   HCT 62.7 03.5 - 00.9 %   MCV 93.7 80.0 - 100.0 fL   MCH 31.7 26.0 - 34.0 pg   MCHC 33.8 30.0 - 36.0 g/dL   RDW 38.1 82.9 - 93.7 %   Platelets  306 150 - 400 K/uL   nRBC 0.0 0.0 - 0.2 %   Neutrophils Relative % 55 %   Neutro Abs 4.2 1.7 - 7.7 K/uL   Lymphocytes Relative 36 %   Lymphs Abs 2.7 0.7 - 4.0 K/uL   Monocytes Relative 5 %   Monocytes Absolute 0.3 0.1 - 1.0 K/uL   Eosinophils Relative 3 %   Eosinophils Absolute 0.2 0.0 - 0.5 K/uL   Basophils Relative 1 %   Basophils Absolute 0.1 0.0 - 0.1 K/uL   Immature Granulocytes 0 %   Abs Immature Granulocytes 0.02 0.00 - 0.07 K/uL  Basic metabolic panel  Result Value Ref Range   Sodium 138 135 - 145 mmol/L   Potassium 5.0 3.5 - 5.1 mmol/L   Chloride 104 98 - 111 mmol/L   CO2 27 22 - 32 mmol/L   Glucose, Bld 121 (H) 70 - 99 mg/dL   BUN 15 6 - 20 mg/dL   Creatinine, Ser 1.69 0.61 - 1.24 mg/dL   Calcium 8.7 (L) 8.9 - 10.3 mg/dL   GFR, Estimated >67 >89 mL/min   Anion gap 7 5 - 15  Hemoglobin A1c  Result Value Ref Range   Hgb A1c MFr Bld 4.8 4.8 - 5.6 %   Mean Plasma Glucose 91.06 mg/dL  HIV Antibody (routine testing w rflx)  Result Value Ref Range   HIV Screen 4th Generation wRfx Non Reactive Non Reactive  CBC  Result Value Ref Range   WBC 8.3 4.0 - 10.5 K/uL   RBC 5.44 4.22 - 5.81 MIL/uL   Hemoglobin 16.1 13.0 - 17.0 g/dL   HCT 38.1 01.7 - 51.0 %   MCV 92.6 80.0 - 100.0 fL   MCH 29.6 26.0 - 34.0 pg   MCHC 31.9 30.0 - 36.0 g/dL   RDW 25.8 52.7 - 78.2 %   Platelets 333 150 - 400 K/uL   nRBC 0.0 0.0 - 0.2 %  Comprehensive metabolic panel  Result Value Ref Range   Sodium 139 135 - 145 mmol/L   Potassium 5.0 3.5 - 5.1 mmol/L   Chloride 106 98 - 111 mmol/L   CO2 25 22 - 32 mmol/L   Glucose, Bld 158 (H) 70 - 99 mg/dL   BUN 19 6 - 20 mg/dL   Creatinine, Ser 4.23 0.61 - 1.24 mg/dL   Calcium 9.3 8.9 - 53.6 mg/dL   Total Protein 7.9 6.5 - 8.1 g/dL   Albumin 4.0 3.5 - 5.0 g/dL   AST 23 15 - 41 U/L   ALT 26 0 - 44 U/L   Alkaline Phosphatase 80 38 - 126 U/L   Total Bilirubin 1.1 0.3 - 1.2 mg/dL   GFR, Estimated >14 >43 mL/min   Anion gap 8 5 - 15  Magnesium  Result Value Ref Range   Magnesium 1.9 1.7 - 2.4 mg/dL  Folate  Result Value Ref Range   Folate 5.5 (L) >5.9 ng/mL  Vitamin B12  Result Value Ref Range   Vitamin B-12 377 180 - 914 pg/mL  VITAMIN D 25 Hydroxy (Vit-D Deficiency, Fractures)  Result Value Ref Range   Vit D, 25-Hydroxy 14.87 (L) 30 - 100 ng/mL  Glucose, capillary  Result Value Ref Range   Glucose-Capillary 131 (H) 70 - 99 mg/dL  JC Virus DNA,PCR (Whole Blood)  Result Value Ref Range   JC Virus DNA, PCR, Blood Negative Negative  CBC  Result Value Ref Range   WBC 23.9 (H) 4.0 - 10.5 K/uL   RBC 5.11 4.22 - 5.81 MIL/uL   Hemoglobin 15.1 13.0 - 17.0 g/dL   HCT 41.3 24.4 - 01.0 %   MCV 91.6 80.0 - 100.0 fL   MCH 29.5 26.0 - 34.0 pg   MCHC 32.3 30.0 - 36.0 g/dL   RDW 27.2 53.6 - 64.4 %   Platelets 329 150 - 400 K/uL   nRBC 0.0 0.0 - 0.2 %  Basic metabolic panel  Result Value Ref Range   Sodium 139 135 - 145 mmol/L   Potassium 4.4 3.5 - 5.1 mmol/L   Chloride 107 98 - 111 mmol/L   CO2 28 22 - 32 mmol/L   Glucose, Bld 166 (H) 70 - 99 mg/dL   BUN 18 6 - 20 mg/dL   Creatinine, Ser 0.34 0.61 - 1.24 mg/dL   Calcium 8.6 (L) 8.9 - 10.3 mg/dL   GFR, Estimated >74 >25 mL/min   Anion gap 4 (L) 5 - 15  Magnesium  Result Value Ref Range   Magnesium 1.9 1.7 - 2.4 mg/dL  Phosphorus  Result Value Ref Range   Phosphorus 3.0 2.5 - 4.6 mg/dL  Hemoglobin Z5G  Result Value Ref Range   Hgb A1c MFr Bld 4.8 4.8 - 5.6 %   Mean Plasma Glucose 91.06 mg/dL  Glucose, capillary  Result Value Ref Range   Glucose-Capillary 160 (H) 70 - 99 mg/dL  Glucose, capillary  Result Value Ref Range   Glucose-Capillary 136 (H) 70 - 99 mg/dL  Glucose, capillary  Result Value Ref Range   Glucose-Capillary 183 (H) 70 - 99 mg/dL  Hepatitis panel, acute  Result Value Ref Range   Hepatitis B Surface Ag NON REACTIVE NON REACTIVE   HCV Ab NON REACTIVE NON REACTIVE   Hep A IgM NON REACTIVE NON REACTIVE   Hep B C IgM NON REACTIVE NON REACTIVE   Glucose, capillary  Result Value Ref Range   Glucose-Capillary 175 (H) 70 - 99 mg/dL  CBC  Result Value Ref Range   WBC 22.9 (H) 4.0 - 10.5 K/uL   RBC 5.21 4.22 - 5.81 MIL/uL   Hemoglobin 15.7 13.0 - 17.0 g/dL   HCT 38.7 56.4 - 33.2 %   MCV 91.2 80.0 - 100.0 fL   MCH 30.1 26.0 - 34.0 pg   MCHC 33.1 30.0 - 36.0 g/dL   RDW 95.1 88.4 - 16.6 %   Platelets 302 150 - 400 K/uL   nRBC 0.0 0.0 - 0.2 %  Basic metabolic panel  Result Value Ref Range   Sodium 139 135 - 145 mmol/L   Potassium 4.6 3.5 - 5.1 mmol/L   Chloride 104 98 - 111 mmol/L   CO2 28 22 - 32 mmol/L   Glucose, Bld 133 (H) 70 - 99 mg/dL   BUN 21 (H)  6 - 20 mg/dL   Creatinine, Ser 1.91 0.61 - 1.24 mg/dL   Calcium 9.1 8.9 - 47.8 mg/dL   GFR, Estimated >29 >56 mL/min   Anion gap 7 5 - 15  Hepatitis B Virus (Profile VI)  Result Value Ref Range   Hep B E Ag test discontinued by [facility]. see MISCLC report   QuantiFERON-TB Gold Plus  Result Value Ref Range   QuantiFERON Incubation Incubation performed.    QuantiFERON-TB Gold Plus Negative Negative  Glucose, capillary  Result Value Ref Range   Glucose-Capillary 121 (H) 70 - 99 mg/dL  Glucose, capillary  Result Value Ref Range   Glucose-Capillary 120 (H) 70 - 99 mg/dL  Glucose, capillary  Result Value Ref Range   Glucose-Capillary 164 (H) 70 - 99 mg/dL  CBC  Result Value Ref Range   WBC 19.3 (H) 4.0 - 10.5 K/uL   RBC 5.23 4.22 - 5.81 MIL/uL   Hemoglobin 16.3 13.0 - 17.0 g/dL   HCT 21.3 08.6 - 57.8 %   MCV 92.0 80.0 - 100.0 fL   MCH 31.2 26.0 - 34.0 pg   MCHC 33.9 30.0 - 36.0 g/dL   RDW 46.9 62.9 - 52.8 %   Platelets 335 150 - 400 K/uL   nRBC 0.0 0.0 - 0.2 %  Basic metabolic panel  Result Value Ref Range   Sodium 139 135 - 145 mmol/L   Potassium 4.1 3.5 - 5.1 mmol/L   Chloride 106 98 - 111 mmol/L   CO2 27 22 - 32 mmol/L   Glucose, Bld 150 (H) 70 - 99 mg/dL   BUN 21 (H) 6 - 20 mg/dL   Creatinine, Ser 4.13 0.61 - 1.24 mg/dL   Calcium 8.6 (L) 8.9 - 10.3 mg/dL    GFR, Estimated >24 >40 mL/min   Anion gap 6 5 - 15  Glucose, capillary  Result Value Ref Range   Glucose-Capillary 188 (H) 70 - 99 mg/dL  Glucose, capillary  Result Value Ref Range   Glucose-Capillary 239 (H) 70 - 99 mg/dL  Glucose, capillary  Result Value Ref Range   Glucose-Capillary 167 (H) 70 - 99 mg/dL   Comment 1 Notify RN   Glucose, capillary  Result Value Ref Range   Glucose-Capillary 202 (H) 70 - 99 mg/dL   Comment 1 Notify RN   Glucose, capillary  Result Value Ref Range   Glucose-Capillary 110 (H) 70 - 99 mg/dL   Comment 1 Notify RN   QuantiFERON-TB Gold Plus  Result Value Ref Range   QuantiFERON Criteria Comment    QuantiFERON TB1 Ag Value 0.02 IU/mL   QuantiFERON TB2 Ag Value 0.01 IU/mL   QuantiFERON Nil Value 0.00 IU/mL   QuantiFERON Mitogen Value >10.00 IU/mL  Miscellaneous [facility] test (send-out)  Result Value Ref Range   [facility] test code [phone]    [facility] test name HEPATITIS B VIRUS SCREENING AND DIAGNOSIS    Misc [facility] result See Scanned report in [place] Link   CBG monitoring, ED  Result Value Ref Range   Glucose-Capillary 233 (H) 70 - 99 mg/dL  CBG monitoring, ED  Result Value Ref Range   Glucose-Capillary 160 (H) 70 - 99 mg/dL      Assessment & Plan:   1. Hospital discharge follow-up -He will send [place] a copy of labs via mychart - CBC with Differential/Platelet - Magnesium - Basic Metabolic Panel (BMET)  2. MS (multiple sclerosis) (HCC) - CBC with Differential/Platelet - Magnesium - Basic Metabolic Panel (BMET)  Follow up plan:  No follow-ups on file.

## 2021-01-25 DIAGNOSIS — R202 Paresthesia of skin: Secondary | ICD-10-CM | POA: Insufficient documentation

## 2021-04-28 DIAGNOSIS — N529 Male erectile dysfunction, unspecified: Secondary | ICD-10-CM | POA: Insufficient documentation

## 2021-05-20 ENCOUNTER — Other Ambulatory Visit: Payer: Self-pay

## 2021-05-20 DIAGNOSIS — E1129 Type 2 diabetes mellitus with other diabetic kidney complication: Secondary | ICD-10-CM

## 2021-05-20 DIAGNOSIS — E785 Hyperlipidemia, unspecified: Secondary | ICD-10-CM

## 2021-05-20 MED ORDER — ROSUVASTATIN CALCIUM 5 MG PO TABS: 5.0000 mg | ORAL_TABLET | Freq: Every day | ORAL | 3 refills | Status: DC

## 2021-07-04 ENCOUNTER — Ambulatory Visit: Payer: No Typology Code available for payment source | Admitting: Nurse Practitioner

## 2021-07-04 ENCOUNTER — Other Ambulatory Visit: Payer: Self-pay

## 2021-07-04 ENCOUNTER — Encounter: Payer: Self-pay | Admitting: Nurse Practitioner

## 2021-07-04 VITALS — BP 130/74 | HR 100 | Temp 98.8°F | Resp 16 | Ht 70.0 in | Wt 239.8 lb

## 2021-07-04 DIAGNOSIS — E1129 Type 2 diabetes mellitus with other diabetic kidney complication: Secondary | ICD-10-CM | POA: Diagnosis not present

## 2021-07-04 DIAGNOSIS — M545 Low back pain, unspecified: Secondary | ICD-10-CM | POA: Diagnosis not present

## 2021-07-04 DIAGNOSIS — I10 Essential (primary) hypertension: Secondary | ICD-10-CM

## 2021-07-04 DIAGNOSIS — R809 Proteinuria, unspecified: Secondary | ICD-10-CM

## 2021-07-04 DIAGNOSIS — E785 Hyperlipidemia, unspecified: Secondary | ICD-10-CM

## 2021-07-04 MED ORDER — TIZANIDINE HCL 4 MG PO TABS: 2.0000 mg | ORAL_TABLET | Freq: Three times a day (TID) | ORAL | 2 refills | Status: DC | PRN

## 2021-07-04 MED ORDER — LISINOPRIL 2.5 MG PO TABS: ORAL_TABLET | ORAL | 3 refills | Status: DC

## 2021-07-04 MED ORDER — ROSUVASTATIN CALCIUM 5 MG PO TABS: 5.0000 mg | ORAL_TABLET | Freq: Every day | ORAL | 3 refills | Status: DC

## 2021-07-04 MED ORDER — FOLIC ACID 1 MG PO TABS: 1.0000 mg | ORAL_TABLET | Freq: Every day | ORAL | 2 refills | Status: DC

## 2021-07-04 NOTE — Progress Notes (Signed)
? ?BP 130/74   Pulse 100   Temp 98.8 ?F (37.1 ?C) (Oral)   Resp 16   Ht 5\' 10"  (1.778 m)   Wt 239 lb 12.8 oz (108.8 kg)   SpO2 98%   BMI 34.41 kg/m?   ? ?Subjective:  ? ? Patient ID: Bill Perez, male    DOB: [birth year], 51 y.o.   MRN: [id number] ? ?HPI: ?Bill Perez is a 51 y.o. male, here alone ? ?Chief Complaint  ?Patient presents with  ? Hypertension  ? Hyperlipidemia  ?  6 month follow up  ? ?Hypertension: His blood pressure today is 130/74.  He denies any chest pain, shortness of breath, headaches or blurred vision. He is currently taking lisinopril 5 mg daily.  ? ?DM2: Last A1C was 4.8 on 12/26/2020.  He is not currently on medications and is controlling with diet.  Will get labs today. He denies any polyuria, polydipsia or polyphagia.  ? ?Hyperlipidemia: He is currently taking rosuvastatin 5 mg daily.  He denies any myalgia. Last LDL was 130 on 11/15/2020.  Will get labs today.  ?The 10-year ASCVD risk score (Arnett DK, et al., 2019) is: 31.5% ?  Values used to calculate the score: ?    Age: 79 years ?    Sex: Male ?    Is Non-Hispanic African American: Yes ?    Diabetic: Yes ?    Tobacco smoker: Yes ?    Systolic Blood Pressure: 130 mmHg ?    Is BP treated: Yes ?    HDL Cholesterol: 25 mg/dL ?    Total Cholesterol: 187 mg/dL  ? ?Chronic low back pain:  He currently takes tizanidine for his back pain. He says he needs a refill.  He says that his back pain has not changed. He says that the tizanidine helps. He says he typically takes 1 tab. Refill sent.  ? ?Relevant past medical, surgical, family and social history reviewed and updated as indicated. Interim medical history since our last visit reviewed. ?Allergies and medications reviewed and updated. ? ?Review of Systems ? ?Constitutional: Negative for fever or weight change.  ?Respiratory: Negative for cough and shortness of breath.   ?Cardiovascular: Negative for chest pain or palpitations.  ?Gastrointestinal: Negative for abdominal pain, no  bowel changes.  ?Musculoskeletal: positive for gait problem (using cane) or joint swelling.  ?Skin: Negative for rash.  ?Neurological: Negative for dizziness or headache.  ?No other specific complaints in a complete review of systems (except as listed in HPI above).  ? ?   ?Objective:  ?  ?BP 130/74   Pulse 100   Temp 98.8 ?F (37.1 ?C) (Oral)   Resp 16   Ht 5\' 10"  (1.778 m)   Wt 239 lb 12.8 oz (108.8 kg)   SpO2 98%   BMI 34.41 kg/m?   ?Wt Readings from Last 3 Encounters:  ?07/04/21 239 lb 12.8 oz (108.8 kg)  ?01/10/21 241 lb 4.8 oz (109.5 kg)  ?01/03/21 241 lb (109.3 kg)  ?  ?Physical Exam ? ?Constitutional: Patient appears well-developed and well-nourished. Obese  No distress.  ?HEENT: head atraumatic, normocephalic, pupils equal and reactive to light,  neck supple ?Cardiovascular: Normal rate, regular rhythm and normal heart sounds.  No murmur heard. No BLE edema. ?Pulmonary/Chest: Effort normal and breath sounds normal. No respiratory distress. ?Abdominal: Soft.  There is no tenderness. ?Psychiatric: Patient has a normal mood and affect. behavior is normal. Judgment and thought content normal.  ? ?Results for orders  placed or performed during the hospital encounter of 12/24/20  ?Resp Panel by RT-PCR (Flu A&B, Covid) Nasopharyngeal Swab  ? Specimen: Nasopharyngeal Swab; Nasopharyngeal(NP) swabs in vial transport medium  ?Result Value Ref Range  ? SARS Coronavirus 2 by RT PCR NEGATIVE NEGATIVE  ? Influenza A by PCR NEGATIVE NEGATIVE  ? Influenza B by PCR NEGATIVE NEGATIVE  ?Sedimentation rate  ?Result Value Ref Range  ? Sed Rate 1 0 - 20 mm/hr  ?C-reactive protein  ?Result Value Ref Range  ? CRP <0.5 <1.0 mg/dL  ?CBC with Differential  ?Result Value Ref Range  ? WBC 7.4 4.0 - 10.5 K/uL  ? RBC 5.24 4.22 - 5.81 MIL/uL  ? Hemoglobin 16.6 13.0 - 17.0 g/dL  ? HCT 49.1 39.0 - 52.0 %  ? MCV 93.7 80.0 - 100.0 fL  ? MCH 31.7 26.0 - 34.0 pg  ? MCHC 33.8 30.0 - 36.0 g/dL  ? RDW 12.7 11.5 - 15.5 %  ? Platelets 306 150 -  400 K/uL  ? nRBC 0.0 0.0 - 0.2 %  ? Neutrophils Relative % 55 %  ? Neutro Abs 4.2 1.7 - 7.7 K/uL  ? Lymphocytes Relative 36 %  ? Lymphs Abs 2.7 0.7 - 4.0 K/uL  ? Monocytes Relative 5 %  ? Monocytes Absolute 0.3 0.1 - 1.0 K/uL  ? Eosinophils Relative 3 %  ? Eosinophils Absolute 0.2 0.0 - 0.5 K/uL  ? Basophils Relative 1 %  ? Basophils Absolute 0.1 0.0 - 0.1 K/uL  ? Immature Granulocytes 0 %  ? Abs Immature Granulocytes 0.02 0.00 - 0.07 K/uL  ?Basic metabolic panel  ?Result Value Ref Range  ? Sodium 138 135 - 145 mmol/L  ? Potassium 5.0 3.5 - 5.1 mmol/L  ? Chloride 104 98 - 111 mmol/L  ? CO2 27 22 - 32 mmol/L  ? Glucose, Bld 121 (H) 70 - 99 mg/dL  ? BUN 15 6 - 20 mg/dL  ? Creatinine, Ser 1.19 0.61 - 1.24 mg/dL  ? Calcium 8.7 (L) 8.9 - 10.3 mg/dL  ? GFR, Estimated >60 >60 mL/min  ? Anion gap 7 5 - 15  ?Hemoglobin A1c  ?Result Value Ref Range  ? Hgb A1c MFr Bld 4.8 4.8 - 5.6 %  ? Mean Plasma Glucose 91.06 mg/dL  ?HIV Antibody (routine testing w rflx)  ?Result Value Ref Range  ? HIV Screen 4th Generation wRfx Non Reactive Non Reactive  ?CBC  ?Result Value Ref Range  ? WBC 8.3 4.0 - 10.5 K/uL  ? RBC 5.44 4.22 - 5.81 MIL/uL  ? Hemoglobin 16.1 13.0 - 17.0 g/dL  ? HCT 50.4 39.0 - 52.0 %  ? MCV 92.6 80.0 - 100.0 fL  ? MCH 29.6 26.0 - 34.0 pg  ? MCHC 31.9 30.0 - 36.0 g/dL  ? RDW 12.5 11.5 - 15.5 %  ? Platelets 333 150 - 400 K/uL  ? nRBC 0.0 0.0 - 0.2 %  ?Comprehensive metabolic panel  ?Result Value Ref Range  ? Sodium 139 135 - 145 mmol/L  ? Potassium 5.0 3.5 - 5.1 mmol/L  ? Chloride 106 98 - 111 mmol/L  ? CO2 25 22 - 32 mmol/L  ? Glucose, Bld 158 (H) 70 - 99 mg/dL  ? BUN 19 6 - 20 mg/dL  ? Creatinine, Ser 1.18 0.61 - 1.24 mg/dL  ? Calcium 9.3 8.9 - 10.3 mg/dL  ? Total Protein 7.9 6.5 - 8.1 g/dL  ? Albumin 4.0 3.5 - 5.0 g/dL  ? AST 23 15 -  41 U/L  ? ALT 26 0 - 44 U/L  ? Alkaline Phosphatase 80 38 - 126 U/L  ? Total Bilirubin 1.1 0.3 - 1.2 mg/dL  ? GFR, Estimated >60 >60 mL/min  ? Anion gap 8 5 - 15  ?Magnesium  ?Result Value  Ref Range  ? Magnesium 1.9 1.7 - 2.4 mg/dL  ?Folate  ?Result Value Ref Range  ? Folate 5.5 (L) >5.9 ng/mL  ?Vitamin B12  ?Result Value Ref Range  ? Vitamin B-12 377 180 - 914 pg/mL  ?VITAMIN D 25 Hydroxy (Vit-D Deficiency, Fractures)  ?Result Value Ref Range  ? Vit D, 25-Hydroxy 14.87 (L) 30 - 100 ng/mL  ?Glucose, capillary  ?Result Value Ref Range  ? Glucose-Capillary 131 (H) 70 - 99 mg/dL  ?JC Virus DNA,PCR (Whole Blood)  ?Result Value Ref Range  ? JC Virus DNA, PCR, Blood Negative Negative  ?CBC  ?Result Value Ref Range  ? WBC 23.9 (H) 4.0 - 10.5 K/uL  ? RBC 5.11 4.22 - 5.81 MIL/uL  ? Hemoglobin 15.1 13.0 - 17.0 g/dL  ? HCT 46.8 39.0 - 52.0 %  ? MCV 91.6 80.0 - 100.0 fL  ? MCH 29.5 26.0 - 34.0 pg  ? MCHC 32.3 30.0 - 36.0 g/dL  ? RDW 12.5 11.5 - 15.5 %  ? Platelets 329 150 - 400 K/uL  ? nRBC 0.0 0.0 - 0.2 %  ?Basic metabolic panel  ?Result Value Ref Range  ? Sodium 139 135 - 145 mmol/L  ? Potassium 4.4 3.5 - 5.1 mmol/L  ? Chloride 107 98 - 111 mmol/L  ? CO2 28 22 - 32 mmol/L  ? Glucose, Bld 166 (H) 70 - 99 mg/dL  ? BUN 18 6 - 20 mg/dL  ? Creatinine, Ser 1.05 0.61 - 1.24 mg/dL  ? Calcium 8.6 (L) 8.9 - 10.3 mg/dL  ? GFR, Estimated >60 >60 mL/min  ? Anion gap 4 (L) 5 - 15  ?Magnesium  ?Result Value Ref Range  ? Magnesium 1.9 1.7 - 2.4 mg/dL  ?Phosphorus  ?Result Value Ref Range  ? Phosphorus 3.0 2.5 - 4.6 mg/dL  ?Hemoglobin A1c  ?Result Value Ref Range  ? Hgb A1c MFr Bld 4.8 4.8 - 5.6 %  ? Mean Plasma Glucose 91.06 mg/dL  ?Glucose, capillary  ?Result Value Ref Range  ? Glucose-Capillary 160 (H) 70 - 99 mg/dL  ?Glucose, capillary  ?Result Value Ref Range  ? Glucose-Capillary 136 (H) 70 - 99 mg/dL  ?Glucose, capillary  ?Result Value Ref Range  ? Glucose-Capillary 183 (H) 70 - 99 mg/dL  ?Hepatitis panel, acute  ?Result Value Ref Range  ? Hepatitis B Surface Ag NON REACTIVE NON REACTIVE  ? HCV Ab NON REACTIVE NON REACTIVE  ? Hep A IgM NON REACTIVE NON REACTIVE  ? Hep B C IgM NON REACTIVE NON REACTIVE  ?Glucose, capillary   ?Result Value Ref Range  ? Glucose-Capillary 175 (H) 70 - 99 mg/dL  ?CBC  ?Result Value Ref Range  ? WBC 22.9 (H) 4.0 - 10.5 K/uL  ? RBC 5.21 4.22 - 5.81 MIL/uL  ? Hemoglobin 15.7 13.0 - 17.0 g/dL  ? HCT 47.5 39.0

## 2021-07-05 LAB — COMPLETE METABOLIC PANEL WITH GFR
AG Ratio: 1.5 (calc) (ref 1.0–2.5)
ALT: 22 U/L (ref 9–46)
AST: 20 U/L (ref 10–35)
Albumin: 4.5 g/dL (ref 3.6–5.1)
Alkaline phosphatase (APISO): 89 U/L (ref 35–144)
BUN: 12 mg/dL (ref 7–25)
CO2: 29 mmol/L (ref 20–32)
Calcium: 10 mg/dL (ref 8.6–10.3)
Chloride: 104 mmol/L (ref 98–110)
Creat: 1.18 mg/dL (ref 0.70–1.30)
Globulin: 3.1 g/dL (calc) (ref 1.9–3.7)
Glucose, Bld: 76 mg/dL (ref 65–99)
Potassium: 5.2 mmol/L (ref 3.5–5.3)
Sodium: 141 mmol/L (ref 135–146)
Total Bilirubin: 1 mg/dL (ref 0.2–1.2)
Total Protein: 7.6 g/dL (ref 6.1–8.1)
eGFR: 75 mL/min/{1.73_m2} (ref >=60)

## 2021-07-05 LAB — CBC WITH DIFFERENTIAL/PLATELET
Absolute Monocytes: 623 cells/uL (ref 200–950)
Basophils Absolute: 68 cells/uL (ref 0–200)
Basophils Relative: 0.9 %
Eosinophils Absolute: 213 cells/uL (ref 15–500)
Eosinophils Relative: 2.8 %
HCT: 49.4 % (ref 38.5–50.0)
Hemoglobin: 15.9 g/dL (ref 13.2–17.1)
Lymphs Abs: 2257 cells/uL (ref 850–3900)
MCH: 29.2 pg (ref 27.0–33.0)
MCHC: 32.2 g/dL (ref 32.0–36.0)
MCV: 90.8 fL (ref 80.0–100.0)
MPV: 9.9 fL (ref 7.5–12.5)
Monocytes Relative: 8.2 %
Neutro Abs: 4438 cells/uL (ref 1500–7800)
Neutrophils Relative %: 58.4 %
Platelets: 404 10*3/uL — ABNORMAL HIGH (ref 140–400)
RBC: 5.44 10*6/uL (ref 4.20–5.80)
RDW: 11.6 % (ref 11.0–15.0)
Total Lymphocyte: 29.7 %
WBC: 7.6 10*3/uL (ref 3.8–10.8)

## 2021-07-05 LAB — LIPID PANEL
Cholesterol: 131 mg/dL (ref <200)
HDL: 28 mg/dL — ABNORMAL LOW (ref >=40)
LDL Cholesterol (Calc): 77 mg/dL (calc)
Non-HDL Cholesterol (Calc): 103 mg/dL (calc) (ref <130)
Total CHOL/HDL Ratio: 4.7 (calc) (ref <5.0)
Triglycerides: 162 mg/dL — ABNORMAL HIGH (ref <150)

## 2021-07-05 LAB — HEMOGLOBIN A1C
Hgb A1c MFr Bld: 5 % of total Hgb (ref <5.7)
Mean Plasma Glucose: 97 mg/dL
eAG (mmol/L): 5.4 mmol/L

## 2021-07-11 ENCOUNTER — Ambulatory Visit: Payer: No Typology Code available for payment source | Admitting: Nurse Practitioner

## 2021-07-17 ENCOUNTER — Other Ambulatory Visit: Payer: Self-pay

## 2021-07-17 ENCOUNTER — Emergency Department
Admission: EM | Admit: 2021-07-17 | Discharge: 2021-07-17 | Disposition: A | Discharge status: Home / Self Care | Payer: No Typology Code available for payment source | Attending: Emergency Medicine | Admitting: Emergency Medicine

## 2021-07-17 ENCOUNTER — Emergency Department: Payer: No Typology Code available for payment source

## 2021-07-17 DIAGNOSIS — S39012A Strain of muscle, fascia and tendon of lower back, initial encounter: Secondary | ICD-10-CM | POA: Diagnosis not present

## 2021-07-17 DIAGNOSIS — S3992XA Unspecified injury of lower back, initial encounter: Secondary | ICD-10-CM | POA: Diagnosis present

## 2021-07-17 DIAGNOSIS — W109XXA Fall (on) (from) unspecified stairs and steps, initial encounter: Secondary | ICD-10-CM | POA: Diagnosis not present

## 2021-07-17 DIAGNOSIS — W19XXXA Unspecified fall, initial encounter: Secondary | ICD-10-CM

## 2021-07-17 HISTORY — DX: Multiple sclerosis: G35

## 2021-07-17 MED ORDER — METHOCARBAMOL 500 MG PO TABS: 500.0000 mg | ORAL_TABLET | Freq: Four times a day (QID) | ORAL | 0 refills | Status: DC

## 2021-07-17 MED ORDER — MELOXICAM 15 MG PO TABS: 15.0000 mg | ORAL_TABLET | Freq: Every day | ORAL | 0 refills | Status: DC

## 2021-07-17 MED ORDER — MELOXICAM 7.5 MG PO TABS
15.0000 mg | ORAL_TABLET | Freq: Once | ORAL | Status: AC
  Administered 2021-07-17: 15 mg via ORAL
  Filled 2021-07-17: qty 2

## 2021-07-17 NOTE — ED Triage Notes (Signed)
Pt to ED  ?Pt has MS and experiences balance issues at times. Pt was going up 2 steps and lost balance and fell on back on grass. Did not hit head. ? ?Also had fall on Friday, and fell on back. Has been having lower back pain since then. ? ?

## 2021-07-17 NOTE — ED Notes (Signed)
Pt declined to have discharge vital signs at this time  

## 2021-07-17 NOTE — ED Provider Notes (Signed)
? ?[facility] ?Provider Note ? ?Patient Contact: 7:01 PM (approximate) ? ? ?History  ? ?Fall and Back Pain ? ? ?HPI ? ?Bill Perez is a 51 y.o. male who presents the emergency department complaining of lower back and bilateral posterior pelvic pain after a fall.  Patient was diagnosed with MS last year, he has had issues with his balance since his diagnosis.  Patient states that he was attempting to go up the stairs without his cane today when he lost his balance and cannot recover.  Patient landed flat on his back.  He is having lower lumbar pain that extends into the hips.  After being helped patient is ambulatory with his cane.  He did not hit his head or lose consciousness.  No radicular symptoms.  No bowel or bladder dysfunction, saddle anesthesia or paresthesias. ?  ? ? ?Physical Exam  ? ?Triage Vital Signs: ?ED Triage Vitals  ?Enc Vitals Group  ?   BP 07/17/21 1735 (!) 144/118  ?   Pulse Rate 07/17/21 1735 (!) 103  ?   Resp 07/17/21 1735 16  ?   Temp 07/17/21 1735 98.2 ?F (36.8 ?C)  ?   Temp Source 07/17/21 1735 Oral  ?   SpO2 07/17/21 1735 99 %  ?   Weight 07/17/21 1736 240 lb (108.9 kg)  ?   Height 07/17/21 1736 5\' 9"  (1.753 m)  ?   Head Circumference --   ?   Peak Flow --   ?   Pain Score 07/17/21 1735 6  ?   Pain Loc --   ?   Pain Edu? --   ?   Excl. in GC? --   ? ? ?Most recent vital signs: ?Vitals:  ? 07/17/21 1735  ?BP: (!) 144/118  ?Pulse: (!) 103  ?Resp: 16  ?Temp: 98.2 ?F (36.8 ?C)  ?SpO2: 99%  ? ? ? ?General: Alert and in no acute distress. ?Head: No acute traumatic findings  ?Neck: No stridor. No cervical spine tenderness to palpation.  ?Cardiovascular:  Good peripheral perfusion ?Respiratory: Normal respiratory effort without tachypnea or retractions. Lungs CTAB. ?Musculoskeletal: Full range of motion to all extremities.  Visualization of the lumbar spine and hips reveals no visible signs of trauma.  Patient is mildly diffusely tender to palpation the bilateral  paraspinal muscles extending into the superior posterior iliac crest region.  There is no midline lumbar tenderness.  No palpable abnormality or step-off.  Patient is ambulatory at this time.  No shortening or rotation of either lower extremity.  Dorsalis pedis pulses sensation intact and equal bilateral lower extremities. ?Neurologic:  No gross focal neurologic deficits are appreciated.  ?Skin:   No rash noted ?Other: ? ? ?ED Results / Procedures / Treatments  ? ?Labs ?(all labs ordered are listed, but only abnormal results are displayed) ?Labs Reviewed - No data to display ? ? ?EKG ? ? ? ? ?RADIOLOGY ? ?I personally viewed and evaluated these images as part of my medical decision making, as well as reviewing the written report by the radiologist. ? ?ED Provider Interpretation: No acute traumatic finding on x-ray of the lower back or pelvis. ? ?DG Lumbar Spine 2-3 Views ? ?Result Date: 07/17/2021 ?CLINICAL DATA:  Recent fall with back pain, initial encounter EXAM: LUMBAR SPINE - 2 VIEW COMPARISON:  None. FINDINGS: Five lumbar type vertebral bodies are well visualized. Vertebral body height is well maintained. Mild osteophytic changes are seen. No anterolisthesis is noted. No soft tissue  abnormality is noted. IMPRESSION: Mild degenerative change without acute abnormality. Electronically Signed   By: Alcide Clever M.D.   On: 07/17/2021 20:03  ? ?DG Pelvis 1-2 Views ? ?Result Date: 07/17/2021 ?CLINICAL DATA:  Recent fall with pelvic pain, initial encounter EXAM: PELVIS - 1 VIEW COMPARISON:  None. FINDINGS: Pelvic ring is intact. No acute fracture or dislocation is noted. Mild degenerative changes of the hip joints are seen. No soft tissue abnormality is noted. IMPRESSION: No acute abnormality seen. Electronically Signed   By: Alcide Clever M.D.   On: 07/17/2021 20:03   ? ?PROCEDURES: ? ?Critical Care performed: No ? ?Procedures ? ? ?MEDICATIONS ORDERED IN ED: ?Medications  ?meloxicam (MOBIC) tablet 15 mg (has no  administration in time range)  ? ? ? ?IMPRESSION / MDM / ASSESSMENT AND PLAN / ED COURSE  ?I reviewed the triage vital signs and the nursing notes. ?             ?               ? ?Differential diagnosis includes, but is not limited to, fall, hip fracture, pelvis fracture, lumbar spine fracture, lumbar strain, contusion ? ? ?Patient's diagnosis is consistent with lumbar strain secondary to a fall.  Patient presented to the emergency department and fell directly onto his back.  Patient has MS and has difficulty with his balance.  Patient states that he typically walks with a cane but did not have his cane with him today, lost balance as he was trying to take 1 step up and fell onto a grassy surface.  Patient was neurologically intact.  Did not hit his head or lose consciousness.  Imaging revealed no acute traumatic finding.  Patient will have symptom control medications of anti-inflammatory muscle relaxer.  Follow-up primary care as needed.  Return precautions discussed with the patient..  Patient is given ED precautions to return to the ED for any worsening or new symptoms. ? ? ? ?  ? ? ?FINAL CLINICAL IMPRESSION(S) / ED DIAGNOSES  ? ?Final diagnoses:  ?Fall, initial encounter  ?Strain of lumbar region, initial encounter  ? ? ? ?Rx / DC Orders  ? ?ED Discharge Orders   ? ?      Ordered  ?  meloxicam (MOBIC) 15 MG tablet  Daily       ? 07/17/21 2127  ?  methocarbamol (ROBAXIN) 500 MG tablet  4 times daily       ? 07/17/21 2127  ? ?  ?  ? ?  ? ? ? ?Note:  This document was prepared using Dragon voice recognition software and may include unintentional dictation errors. ?  ?Racheal Patches, PA-C ?07/17/21 2128 ? ?  ?Gilles Chiquito, MD ?07/18/21 [phone] ? ?

## 2021-11-28 ENCOUNTER — Other Ambulatory Visit: Payer: Self-pay | Admitting: Neurology

## 2021-11-28 DIAGNOSIS — G35 Multiple sclerosis: Secondary | ICD-10-CM

## 2021-12-19 ENCOUNTER — Ambulatory Visit: Admission: RE | Admit: 2021-12-19 | Payer: No Typology Code available for payment source | Source: Ambulatory Visit

## 2021-12-19 ENCOUNTER — Ambulatory Visit: Payer: No Typology Code available for payment source

## 2021-12-29 ENCOUNTER — Ambulatory Visit: Payer: BC Managed Care – PPO

## 2021-12-29 ENCOUNTER — Ambulatory Visit
Admission: RE | Admit: 2021-12-29 | Discharge: 2021-12-29 | Disposition: A | Discharge status: Home / Self Care | Payer: BC Managed Care – PPO | Source: Ambulatory Visit | Attending: Neurology | Admitting: Neurology

## 2021-12-29 ENCOUNTER — Ambulatory Visit: Payer: No Typology Code available for payment source

## 2021-12-29 ENCOUNTER — Ambulatory Visit (HOSPITAL_BASED_OUTPATIENT_CLINIC_OR_DEPARTMENT_OTHER): Payer: No Typology Code available for payment source

## 2021-12-29 ENCOUNTER — Ambulatory Visit (HOSPITAL_COMMUNITY): Payer: No Typology Code available for payment source

## 2021-12-29 ENCOUNTER — Other Ambulatory Visit: Payer: No Typology Code available for payment source

## 2021-12-29 DIAGNOSIS — G35 Multiple sclerosis: Secondary | ICD-10-CM | POA: Insufficient documentation

## 2021-12-29 MED ORDER — GADOPICLENOL 0.5 MMOL/ML IV SOLN
10.0000 mL | Freq: Once | INTRAVENOUS | Status: AC | PRN
  Administered 2021-12-29: 10 mL via INTRAVENOUS

## 2021-12-31 ENCOUNTER — Ambulatory Visit: Payer: No Typology Code available for payment source

## 2022-01-09 ENCOUNTER — Ambulatory Visit: Payer: No Typology Code available for payment source | Admitting: Nurse Practitioner

## 2022-01-09 NOTE — Progress Notes (Deleted)
There were no vitals taken for this visit.   Subjective:    Patient ID: Bill Perez, male    DOB: Jun 18, 1970, 51 y.o.   MRN: [id number]  HPI: CAELEB Perez is a 51 y.o. male, here alone  No chief complaint on file.  Hypertension: His blood pressure today is ***.  He is currently taking lisinopril 5 mg daily.  He denies any shortness of breath, chest pain, blurred vision or headaches.  DM2: His last A1c was 5.0 on 07/04/2021.  He is not currently on medications and is controlling this with diet.  He denies any polyuria, polyphasia or polydipsia.  Hyperlipidemia: His last LDL was 77 on 07/04/2021.  He is currently taking rosuvastatin 5 mg daily.  He denies any myalgia.  The 10-year ASCVD risk score (Arnett DK, et al., 2019) is: 34.2%   Values used to calculate the score:     Age: 40 years     Sex: Male     Is Non-Hispanic African American: Yes     Diabetic: Yes     Tobacco smoker: Yes     Systolic Blood Pressure: 867 mmHg     Is BP treated: Yes     HDL Cholesterol: 28 mg/dL     Total Cholesterol: 131 mg/dL   Chronic low back pain: Patient reports he has chronic low back pain.  He typically takes tizanidine for his pain.  MS: Patient is currently seeing Dr. Alfonzo Feller of [facility] neurology.  Relevant past medical, surgical, family and social history reviewed and updated as indicated. Interim medical history since our last visit reviewed. Allergies and medications reviewed and updated.  Review of Systems  Constitutional: Negative for fever or weight change.  Respiratory: Negative for cough and shortness of breath.   Cardiovascular: Negative for chest pain or palpitations.  Gastrointestinal: Negative for abdominal pain, no bowel changes.  Musculoskeletal: positive for gait problem (using cane) or joint swelling.  Skin: Negative for rash.  Neurological: Negative for dizziness or headache.  No other specific complaints in a complete review of systems (except as listed  in HPI above).      Objective:    There were no vitals taken for this visit.  Wt Readings from Last 3 Encounters:  07/17/21 240 lb (108.9 kg)  07/04/21 239 lb 12.8 oz (108.8 kg)  01/10/21 241 lb 4.8 oz (109.5 kg)    Physical Exam  Constitutional: Patient appears well-developed and well-nourished. Obese  No distress.  HEENT: head atraumatic, normocephalic, pupils equal and reactive to light,  neck supple Cardiovascular: Normal rate, regular rhythm and normal heart sounds.  No murmur heard. No BLE edema. Pulmonary/Chest: Effort normal and breath sounds normal. No respiratory distress. Abdominal: Soft.  There is no tenderness. Psychiatric: Patient has a normal mood and affect. behavior is normal. Judgment and thought content normal.   Results for orders placed or performed in visit on 07/04/21  Lipid panel  Result Value Ref Range   Cholesterol 131 <200 mg/dL   HDL 28 (L) > OR = 40 mg/dL   Triglycerides 162 (H) <150 mg/dL   LDL Cholesterol (Calc) 77 mg/dL (calc)   Total CHOL/HDL Ratio 4.7 <5.0 (calc)   Non-HDL Cholesterol (Calc) 103 <130 mg/dL (calc)  CBC with Differential/Platelet  Result Value Ref Range   WBC 7.6 3.8 - 10.8 Thousand/uL   RBC 5.44 4.20 - 5.80 Million/uL   Hemoglobin 15.9 13.2 - 17.1 g/dL   HCT 49.4 38.5 - 50.0 %  MCV 90.8 80.0 - 100.0 fL   MCH 29.2 27.0 - 33.0 pg   MCHC 32.2 32.0 - 36.0 g/dL   RDW 11.6 11.0 - 15.0 %   Platelets 404 (H) 140 - 400 Thousand/uL   MPV 9.9 7.5 - 12.5 fL   Neutro Abs 4,438 1,500 - 7,800 cells/uL   Lymphs Abs 2,257 850 - 3,900 cells/uL   Absolute Monocytes 623 200 - 950 cells/uL   Eosinophils Absolute 213 15 - 500 cells/uL   Basophils Absolute 68 0 - 200 cells/uL   Neutrophils Relative % 58.4 %   Total Lymphocyte 29.7 %   Monocytes Relative 8.2 %   Eosinophils Relative 2.8 %   Basophils Relative 0.9 %  COMPLETE METABOLIC PANEL WITH GFR  Result Value Ref Range   Glucose, Bld 76 65 - 99 mg/dL   BUN 12 7 - 25 mg/dL   Creat  1.18 0.70 - 1.30 mg/dL   eGFR 75 > OR = 60 mL/min/1.29m   BUN/Creatinine Ratio NOT APPLICABLE 6 - 22 (calc)   Sodium 141 135 - 146 mmol/L   Potassium 5.2 3.5 - 5.3 mmol/L   Chloride 104 98 - 110 mmol/L   CO2 29 20 - 32 mmol/L   Calcium 10.0 8.6 - 10.3 mg/dL   Total Protein 7.6 6.1 - 8.1 g/dL   Albumin 4.5 3.6 - 5.1 g/dL   Globulin 3.1 1.9 - 3.7 g/dL (calc)   AG Ratio 1.5 1.0 - 2.5 (calc)   Total Bilirubin 1.0 0.2 - 1.2 mg/dL   Alkaline phosphatase (APISO) 89 35 - 144 U/L   AST 20 10 - 35 U/L   ALT 22 9 - 46 U/L  Hemoglobin A1c  Result Value Ref Range   Hgb A1c MFr Bld 5.0 <5.7 % of total Hgb   Mean Plasma Glucose 97 mg/dL   eAG (mmol/L) 5.4 mmol/L      Assessment & Plan:   1. Dyslipidemia  - Lipid panel - COMPLETE METABOLIC PANEL WITH GFR - rosuvastatin (CRESTOR) 5 MG tablet; Take 1 tablet (5 mg total) by mouth at bedtime.  Dispense: 90 tablet; Refill: 3  2. Type 2 diabetes mellitus with microalbuminuria, without long-term current use of insulin (HCC)  - COMPLETE METABOLIC PANEL WITH GFR - Hemoglobin A1c - lisinopril (ZESTRIL) 2.5 MG tablet; TAKE 1 TABLET(2.5 MG) BY MOUTH DAILY  Dispense: 90 tablet; Refill: 3 - rosuvastatin (CRESTOR) 5 MG tablet; Take 1 tablet (5 mg total) by mouth at bedtime.  Dispense: 90 tablet; Refill: 3  3. Hypertension, unspecified type  - CBC with Differential/Platelet - COMPLETE METABOLIC PANEL WITH GFR - lisinopril (ZESTRIL) 2.5 MG tablet; TAKE 1 TABLET(2.5 MG) BY MOUTH DAILY  Dispense: 90 tablet; Refill: 3  4. Left low back pain, unspecified chronicity, unspecified whether sciatica present  - tiZANidine (ZANAFLEX) 4 MG tablet; Take 0.5-1.5 tablets (2-6 mg total) by mouth every 8 (eight) hours as needed for muscle spasms (muscle tightness).  Dispense: 90 tablet; Refill: 2   Follow up plan: No follow-ups on file.

## 2022-01-10 ENCOUNTER — Ambulatory Visit (INDEPENDENT_AMBULATORY_CARE_PROVIDER_SITE_OTHER): Payer: No Typology Code available for payment source | Admitting: Nurse Practitioner

## 2022-01-10 ENCOUNTER — Encounter: Payer: Self-pay | Admitting: Nurse Practitioner

## 2022-01-10 ENCOUNTER — Other Ambulatory Visit: Payer: Self-pay

## 2022-01-10 VITALS — BP 132/84 | HR 98 | Temp 98.8°F | Resp 18 | Ht 70.0 in | Wt 227.1 lb

## 2022-01-10 DIAGNOSIS — E785 Hyperlipidemia, unspecified: Secondary | ICD-10-CM

## 2022-01-10 DIAGNOSIS — G35D Multiple sclerosis, unspecified: Secondary | ICD-10-CM

## 2022-01-10 DIAGNOSIS — I10 Essential (primary) hypertension: Secondary | ICD-10-CM

## 2022-01-10 DIAGNOSIS — G35 Multiple sclerosis: Secondary | ICD-10-CM

## 2022-01-10 DIAGNOSIS — E1129 Type 2 diabetes mellitus with other diabetic kidney complication: Secondary | ICD-10-CM | POA: Insufficient documentation

## 2022-01-10 DIAGNOSIS — M545 Low back pain, unspecified: Secondary | ICD-10-CM

## 2022-01-10 DIAGNOSIS — R809 Proteinuria, unspecified: Secondary | ICD-10-CM

## 2022-01-10 DIAGNOSIS — Z6835 Body mass index (BMI) 35.0-35.9, adult: Secondary | ICD-10-CM

## 2022-01-10 DIAGNOSIS — E66812 Obesity, class 2: Secondary | ICD-10-CM

## 2022-01-10 MED ORDER — ROSUVASTATIN CALCIUM 5 MG PO TABS: 5.0000 mg | ORAL_TABLET | Freq: Every day | ORAL | 3 refills | Status: DC

## 2022-01-10 MED ORDER — TIZANIDINE HCL 4 MG PO TABS: 2.0000 mg | ORAL_TABLET | Freq: Three times a day (TID) | ORAL | 2 refills | Status: DC | PRN

## 2022-01-10 MED ORDER — LISINOPRIL 2.5 MG PO TABS: ORAL_TABLET | ORAL | 3 refills | Status: DC

## 2022-01-10 NOTE — Assessment & Plan Note (Signed)
Continue  taking rosuvastatin 5 mg daily. 

## 2022-01-10 NOTE — Assessment & Plan Note (Signed)
Keep follow-up appointment with neurologist. Continue taking Kesimta.

## 2022-01-10 NOTE — Assessment & Plan Note (Signed)
Work on increasing physical activity as tolerated and eating a well-balanced diet with portion control.

## 2022-01-10 NOTE — Progress Notes (Signed)
BP 132/84   Pulse 98   Temp 98.8 F (37.1 C) (Oral)   Resp 18   Ht _0  (1.778 m)   Wt 227 lb 1.6 oz (103 kg)   SpO2 98%   BMI 32.59 kg/m    Subjective:    Patient ID: Bill Perez, male    DOB: 1971-01-23, 51 y.o.   MRN: [id number]  HPI: Bill Perez is a 51 y.o. male, here alone  Chief Complaint  Patient presents with   Results    MRI    Hypertension   Hyperlipidemia    3 month follow up   Hypertension: His blood pressure today is 132/84.  He is currently taking lisinopril 5 mg daily.  He denies any shortness of breath, chest pain, blurred vision or headaches.  DM2: His last A1c was 5.0 on 07/04/2021.  He is not currently on medications and is controlling this with diet.  He denies any polyuria, polyphasia or polydipsia. He is due for microalbumin urine and foot exam.  Hyperlipidemia: His last LDL was 77 on 07/04/2021.  He is currently taking rosuvastatin 5 mg daily.  He denies any myalgia.  The 10-year ASCVD risk score (Arnett DK, et al., 2019) is: 29.9%   Values used to calculate the score:     Age: 4 years     Sex: Male     Is Non-Hispanic African American: Yes     Diabetic: Yes     Tobacco smoker: Yes     Systolic Blood Pressure: 597 mmHg     Is BP treated: Yes     HDL Cholesterol: 28 mg/dL     Total Cholesterol: 131 mg/dL   Obesity: his current weight is 227 lbs with a BMI of 32.59. Recommend increasing physical activity as tolerated and eating a well balanced diet with portion control.   Chronic low back pain: Patient reports he has chronic low back pain.  He typically takes tizanidine for his pain.  MS: Patient is currently seeing Dr. Alfonzo Feller of [facility] neurology. Patient recently had MRI of brain and c-spine that showed:Multifocal T2 FLAIR hyperintense signal abnormality within the supratentorial and infratentorial white matter, compatible with the provided history of multiple sclerosis. An 8 mm lesion within the anterior left temporal  lobe periventricular white matter has increased in size since the prior brain MRI of 12/24/2020. The lesions otherwise appears stable in size and number. No pathologic intracranial enhancement to suggest active demyelination.   Generalized parenchymal atrophy, mild but advanced for age.   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   Mild paranasal sinus disease, as described.  1. Focal demyelinating lesions involving the left hemi cord at C4-5 and right hemicord at C7-T1. The lesion at the level of T1 has contracted and is decreased in size since previous exam. No new lesions to suggest disease progression. No evidence for active demyelination. 2. Underlying multilevel cervical spondylosis without significant spinal stenosis. Moderate to severe left C3 foraminal narrowing, with mild to moderate bilateral C4 through C6 foraminal narrowing as above. He has an appointment with Neurology on November. He says he is maintaining and only symptom lately has been fatigue.   Relevant past medical, surgical, family and social history reviewed and updated as indicated. Interim medical history since our last visit reviewed. Allergies and medications reviewed and updated.  Review of Systems  Constitutional: Negative for fever or weight change.  Respiratory: Negative for cough and shortness of  breath.   Cardiovascular: Negative for chest pain or palpitations.  Gastrointestinal: Negative for abdominal pain, no bowel changes.  Musculoskeletal: positive for gait problem (using cane) or joint swelling.  Skin: Negative for rash.  Neurological: Negative for dizziness or headache.  No other specific complaints in a complete review of systems (except as listed in HPI above).      Objective:    BP 132/84   Pulse 98   Temp 98.8 F (37.1 C) (Oral)   Resp 18   Ht _0  (1.778 m)   Wt 227 lb 1.6 oz (103 kg)   SpO2 98%   BMI 32.59 kg/m   Wt Readings  from Last 3 Encounters:  01/10/22 227 lb 1.6 oz (103 kg)  07/17/21 240 lb (108.9 kg)  07/04/21 239 lb 12.8 oz (108.8 kg)    Physical Exam  Constitutional: Patient appears well-developed and well-nourished. Obese  No distress.  HEENT: head atraumatic, normocephalic, pupils equal and reactive to light,  neck supple Cardiovascular: Normal rate, regular rhythm and normal heart sounds.  No murmur heard. No BLE edema. Pulmonary/Chest: Effort normal and breath sounds normal. No respiratory distress. Abdominal: Soft.  There is no tenderness. Psychiatric: Patient has a normal mood and affect. behavior is normal. Judgment and thought content normal.  Diabetic Foot Exam - Simple   Simple Foot Form Diabetic Foot exam was performed with the following findings: Yes 01/10/2022  1:10 PM  Visual Inspection No deformities, no ulcerations, no other skin breakdown bilaterally: Yes Sensation Testing Intact to touch and monofilament testing bilaterally: Yes Pulse Check Posterior Tibialis and Dorsalis pulse intact bilaterally: Yes Comments     Results for orders placed or performed in visit on 07/04/21  Lipid panel  Result Value Ref Range   Cholesterol 131 <200 mg/dL   HDL 28 (L) > OR = 40 mg/dL   Triglycerides 162 (H) <150 mg/dL   LDL Cholesterol (Calc) 77 mg/dL (calc)   Total CHOL/HDL Ratio 4.7 <5.0 (calc)   Non-HDL Cholesterol (Calc) 103 <130 mg/dL (calc)  CBC with Differential/Platelet  Result Value Ref Range   WBC 7.6 3.8 - 10.8 Thousand/uL   RBC 5.44 4.20 - 5.80 Million/uL   Hemoglobin 15.9 13.2 - 17.1 g/dL   HCT 49.4 38.5 - 50.0 %   MCV 90.8 80.0 - 100.0 fL   MCH 29.2 27.0 - 33.0 pg   MCHC 32.2 32.0 - 36.0 g/dL   RDW 11.6 11.0 - 15.0 %   Platelets 404 (H) 140 - 400 Thousand/uL   MPV 9.9 7.5 - 12.5 fL   Neutro Abs 4,438 1,500 - 7,800 cells/uL   Lymphs Abs 2,257 850 - 3,900 cells/uL   Absolute Monocytes 623 200 - 950 cells/uL   Eosinophils Absolute 213 15 - 500 cells/uL   Basophils  Absolute 68 0 - 200 cells/uL   Neutrophils Relative % 58.4 %   Total Lymphocyte 29.7 %   Monocytes Relative 8.2 %   Eosinophils Relative 2.8 %   Basophils Relative 0.9 %  COMPLETE METABOLIC PANEL WITH GFR  Result Value Ref Range   Glucose, Bld 76 65 - 99 mg/dL   BUN 12 7 - 25 mg/dL   Creat 1.18 0.70 - 1.30 mg/dL   eGFR 75 > OR = 60 mL/min/1.39m   BUN/Creatinine Ratio NOT APPLICABLE 6 - 22 (calc)   Sodium 141 135 - 146 mmol/L   Potassium 5.2 3.5 - 5.3 mmol/L   Chloride 104 98 - 110 mmol/L   CO2 29  20 - 32 mmol/L   Calcium 10.0 8.6 - 10.3 mg/dL   Total Protein 7.6 6.1 - 8.1 g/dL   Albumin 4.5 3.6 - 5.1 g/dL   Globulin 3.1 1.9 - 3.7 g/dL (calc)   AG Ratio 1.5 1.0 - 2.5 (calc)   Total Bilirubin 1.0 0.2 - 1.2 mg/dL   Alkaline phosphatase (APISO) 89 35 - 144 U/L   AST 20 10 - 35 U/L   ALT 22 9 - 46 U/L  Hemoglobin A1c  Result Value Ref Range   Hgb A1c MFr Bld 5.0 <5.7 % of total Hgb   Mean Plasma Glucose 97 mg/dL   eAG (mmol/L) 5.4 mmol/L      Assessment & Plan:   Problem List Items Addressed This Visit       Cardiovascular and Mediastinum   Hypertension    Continue taking lisinopril 5 mg daily.      Relevant Medications   rosuvastatin (CRESTOR) 5 MG tablet   lisinopril (ZESTRIL) 2.5 MG tablet   Other Relevant Orders   CBC with Differential/Platelet   COMPLETE METABOLIC PANEL WITH GFR     Endocrine   Type 2 diabetes mellitus with microalbuminuria, without long-term current use of insulin (HCC)    Not currently taking medications.  Diabetes is diet controlled.      Relevant Medications   rosuvastatin (CRESTOR) 5 MG tablet   lisinopril (ZESTRIL) 2.5 MG tablet   Other Relevant Orders   COMPLETE METABOLIC PANEL WITH GFR   Hemoglobin A1c   HM Diabetes Foot Exam (Completed)   Microalbumin / creatinine urine ratio     Nervous and Auditory   MS (multiple sclerosis) ([place])    Keep follow-up appointment with neurologist. Continue taking Kesimta.         Other    Dyslipidemia - Primary    Continue taking rosuvastatin 5 mg daily.      Relevant Medications   rosuvastatin (CRESTOR) 5 MG tablet   Other Relevant Orders   Lipid panel   COMPLETE METABOLIC PANEL WITH GFR   Class 2 severe obesity due to excess calories with serious comorbidity and body mass index (BMI) of 35.0 to 35.9 in adult [facility])    Work on increasing physical activity as tolerated and eating a well-balanced diet with portion control.      Relevant Orders   Lipid panel   CBC with Differential/Platelet   COMPLETE METABOLIC PANEL WITH GFR   Hemoglobin A1c   Left low back pain    Continue taking tizanidine and gabapentin for your pain.      Relevant Medications   tiZANidine (ZANAFLEX) 4 MG tablet     Follow up plan: Return in about 6 months (around 07/12/2022) for follow up.

## 2022-01-10 NOTE — Assessment & Plan Note (Signed)
Continue taking tizanidine and gabapentin for your pain.

## 2022-01-10 NOTE — Assessment & Plan Note (Signed)
Continue taking lisinopril 5 mg daily. 

## 2022-01-10 NOTE — Assessment & Plan Note (Addendum)
Not currently taking medications.  Diabetes is diet controlled.

## 2022-01-11 LAB — COMPLETE METABOLIC PANEL WITH GFR
AG Ratio: 1.5 (calc) (ref 1.0–2.5)
ALT: 16 U/L (ref 9–46)
AST: 17 U/L (ref 10–35)
Albumin: 4.3 g/dL (ref 3.6–5.1)
Alkaline phosphatase (APISO): 89 U/L (ref 35–144)
BUN/Creatinine Ratio: 9 (calc) (ref 6–22)
BUN: 12 mg/dL (ref 7–25)
CO2: 23 mmol/L (ref 20–32)
Calcium: 9.6 mg/dL (ref 8.6–10.3)
Chloride: 106 mmol/L (ref 98–110)
Creat: 1.31 mg/dL — ABNORMAL HIGH (ref 0.70–1.30)
Globulin: 2.9 g/dL (calc) (ref 1.9–3.7)
Glucose, Bld: 72 mg/dL (ref 65–139)
Potassium: 4.5 mmol/L (ref 3.5–5.3)
Sodium: 138 mmol/L (ref 135–146)
Total Bilirubin: 0.7 mg/dL (ref 0.2–1.2)
Total Protein: 7.2 g/dL (ref 6.1–8.1)
eGFR: 66 mL/min/{1.73_m2} (ref >=60)

## 2022-01-11 LAB — CBC WITH DIFFERENTIAL/PLATELET
Absolute Monocytes: 571 cells/uL (ref 200–950)
Basophils Absolute: 50 cells/uL (ref 0–200)
Basophils Relative: 0.6 %
Eosinophils Absolute: 168 cells/uL (ref 15–500)
Eosinophils Relative: 2 %
HCT: 48 % (ref 38.5–50.0)
Hemoglobin: 16 g/dL (ref 13.2–17.1)
Lymphs Abs: 2344 cells/uL (ref 850–3900)
MCH: 30.2 pg (ref 27.0–33.0)
MCHC: 33.3 g/dL (ref 32.0–36.0)
MCV: 90.7 fL (ref 80.0–100.0)
MPV: 10.1 fL (ref 7.5–12.5)
Monocytes Relative: 6.8 %
Neutro Abs: 5267 cells/uL (ref 1500–7800)
Neutrophils Relative %: 62.7 %
Platelets: 367 10*3/uL (ref 140–400)
RBC: 5.29 10*6/uL (ref 4.20–5.80)
RDW: 11.9 % (ref 11.0–15.0)
Total Lymphocyte: 27.9 %
WBC: 8.4 10*3/uL (ref 3.8–10.8)

## 2022-01-11 LAB — LIPID PANEL
Cholesterol: 141 mg/dL
HDL: 27 mg/dL — ABNORMAL LOW
LDL Cholesterol (Calc): 84 mg/dL
Non-HDL Cholesterol (Calc): 114 mg/dL
Total CHOL/HDL Ratio: 5.2 (calc) — ABNORMAL HIGH
Triglycerides: 204 mg/dL — ABNORMAL HIGH

## 2022-01-11 LAB — HEMOGLOBIN A1C
Hgb A1c MFr Bld: 4.9 %{Hb}
Mean Plasma Glucose: 94 mg/dL
eAG (mmol/L): 5.2 mmol/L

## 2022-01-11 LAB — MICROALBUMIN / CREATININE URINE RATIO
Creatinine, Urine: 231 mg/dL (ref 20–320)
Microalb Creat Ratio: 47 mcg/mg creat — ABNORMAL HIGH (ref <30)
Microalb, Ur: 10.8 mg/dL

## 2022-02-01 IMAGING — MR MR CERVICAL SPINE WO/W CM
5 of 8 series · 29 of 48 positions shown · IV contrast (gadavist)
Comparison: None.

CLINICAL DATA: Myelopathy, acute or progressive. Findings
concerning for demyelinating disease on MRI of the brain and orbits.

EXAM:
MRI CERVICAL AND THORACIC SPINE WITHOUT AND WITH CONTRAST
TECHNIQUE: Multiplanar and multiecho pulse sequences of the cervical spine, to
include the craniocervical junction and cervicothoracic junction,
and the thoracic spine, were obtained without and with intravenous
contrast.
CONTRAST:  10mL GADAVIST GADOBUTROL 1 MMOL/ML IV SOLN

[Series 25: T2 · sagittal · 3.0mm · 0.62mm/px · 4 of 15 slices shown (1 of 2)]
[im 1/15]
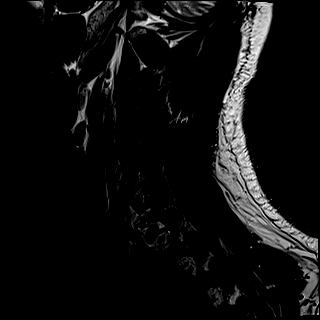
[im 5/15]
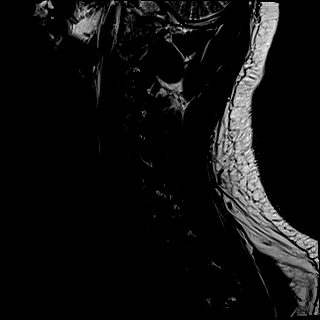
[im 10/15]
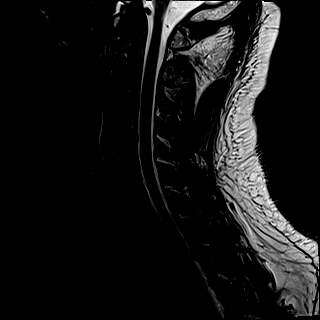
[im 15/15]
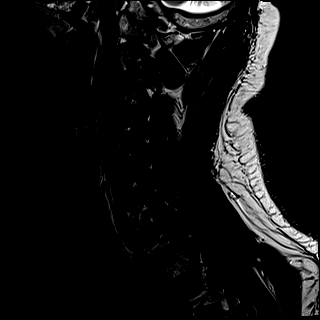

[Series 27: STIR · sagittal · 3.0mm · 0.62mm/px · 4 of 15 slices shown]
[im 1/15]
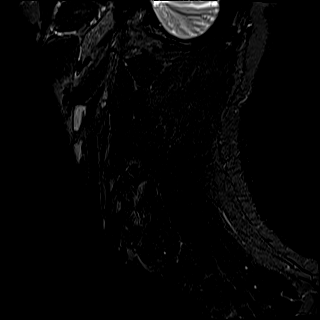
[im 5/15]
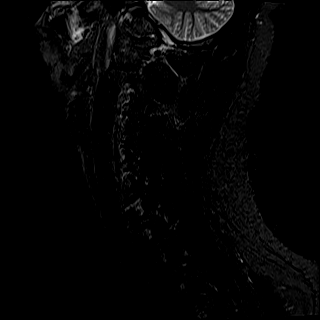
[im 10/15]
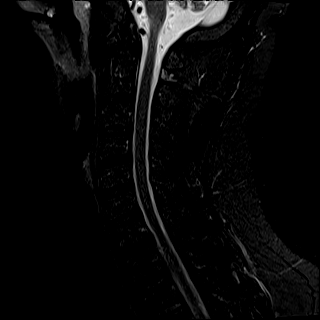
[im 15/15]
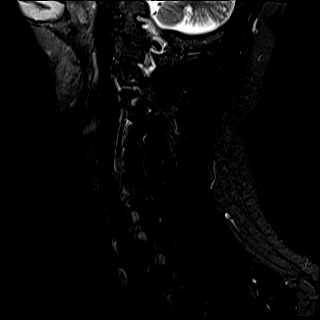

[Series 28: T1 · axial · non-contrast · 3.0mm · 0.35mm/px · z∈[+121,+230]mm · 8 of 32 slices shown]
[im 1/32]
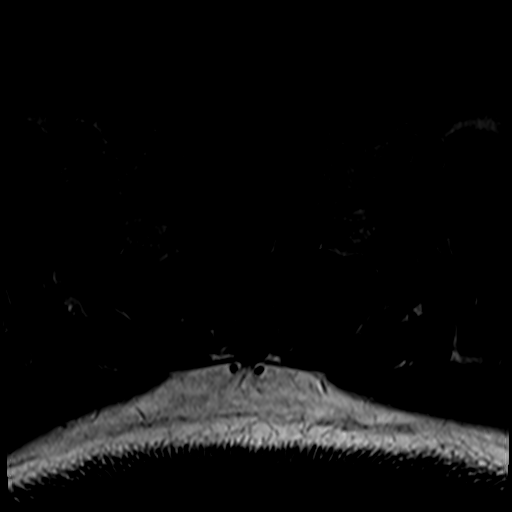
[im 5/32]
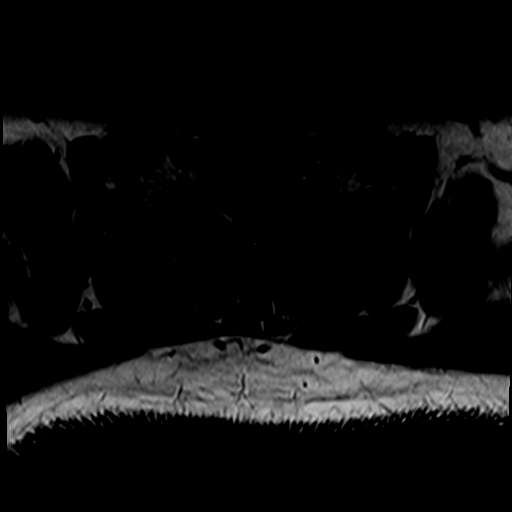
[im 9/32]
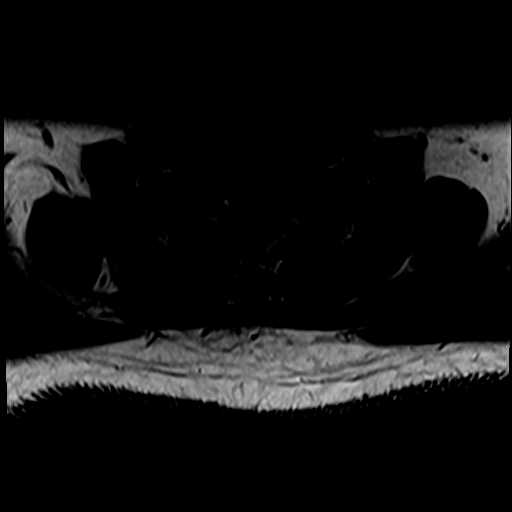
[im 14/32]
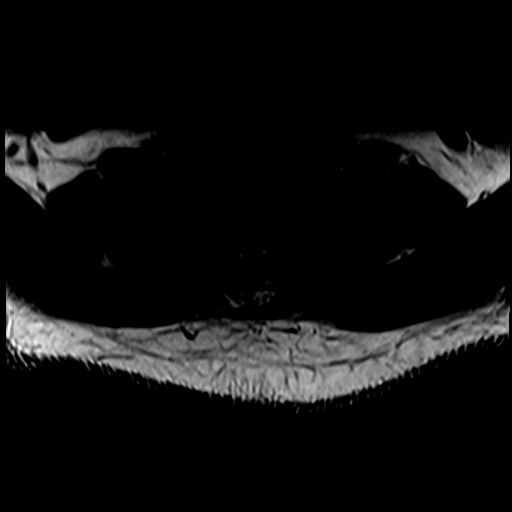
[im 18/32]
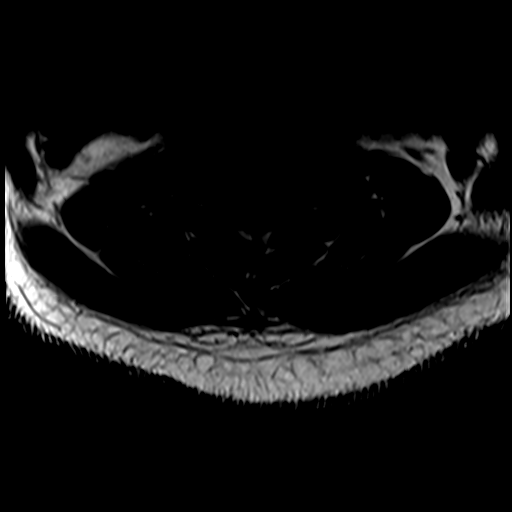
[im 23/32]
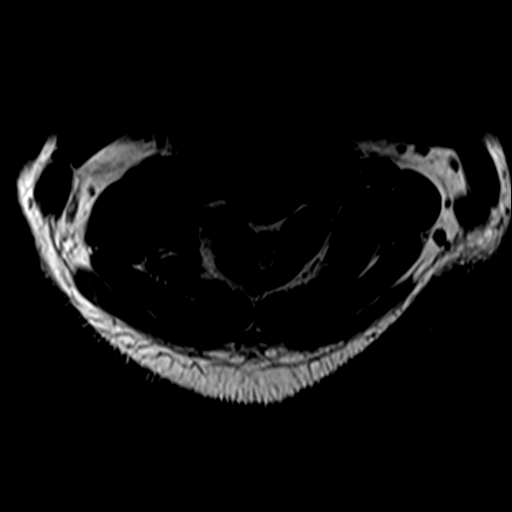
[im 27/32]
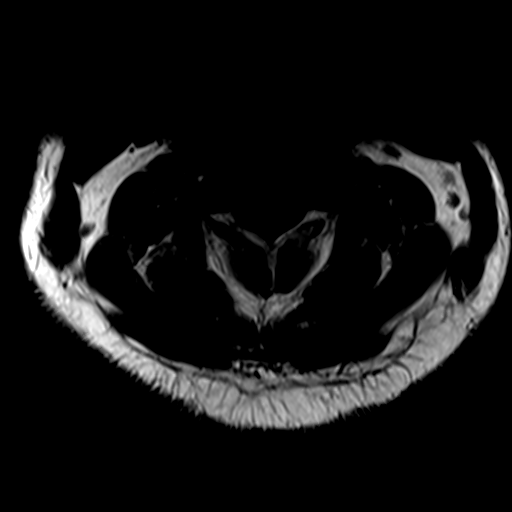
[im 32/32]
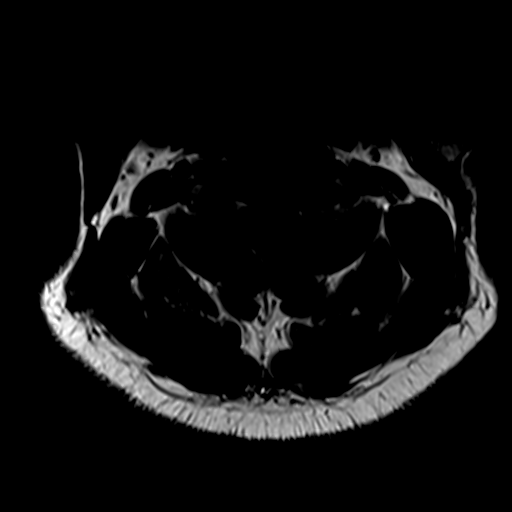

[Series 29: T2 · axial · 3.0mm · 0.70mm/px · z∈[+121,+230]mm · 8 of 32 slices shown (2 of 2)]
[im 1/32]
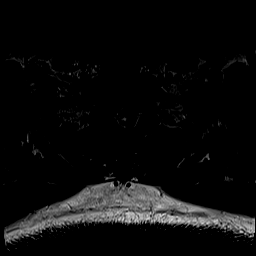
[im 5/32]
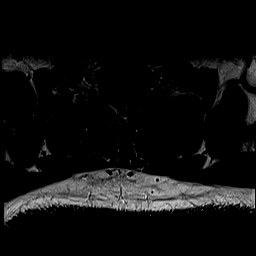
[im 9/32]
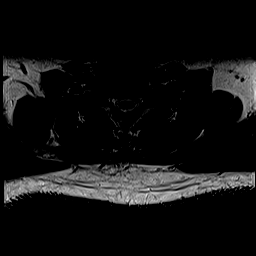
[im 14/32]
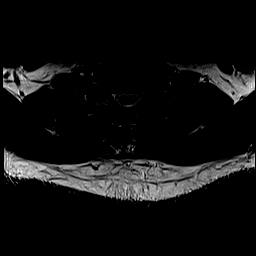
[im 18/32]
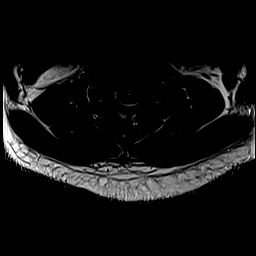
[im 23/32]
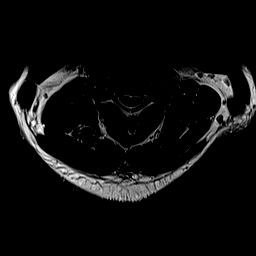
[im 27/32]
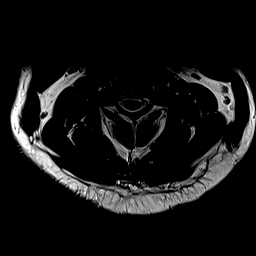
[im 32/32]
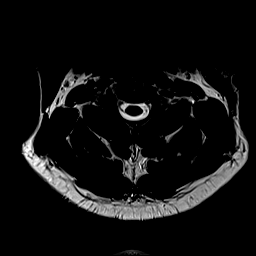

[Series 32: T1 post-contrast · axial · 3.0mm · 0.35mm/px · z∈[+121,+181]mm · 5 of 32 slices shown]
[im 1/32]
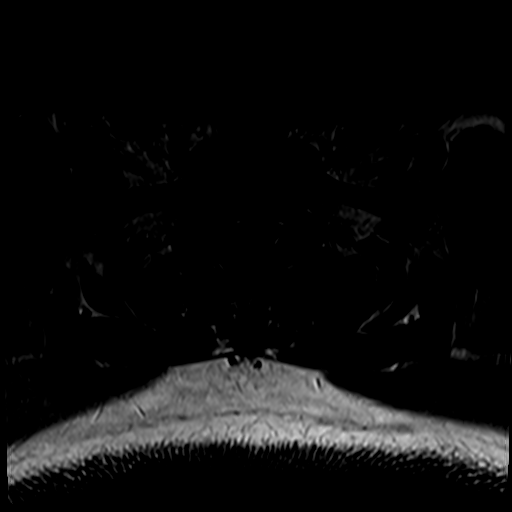
[im 5/32]
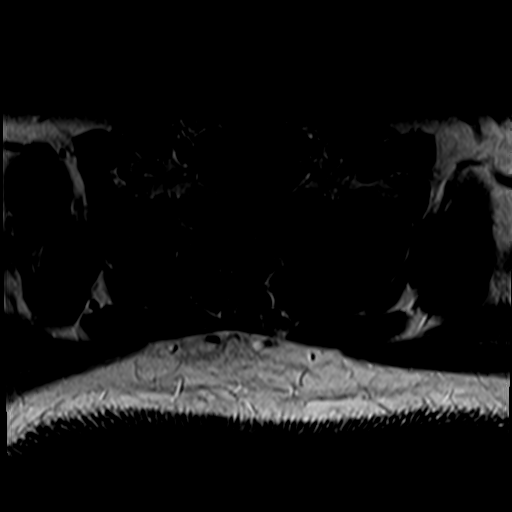
[im 9/32]
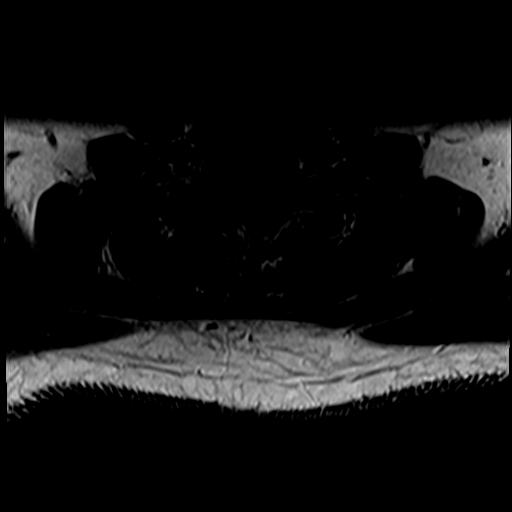
[im 14/32]
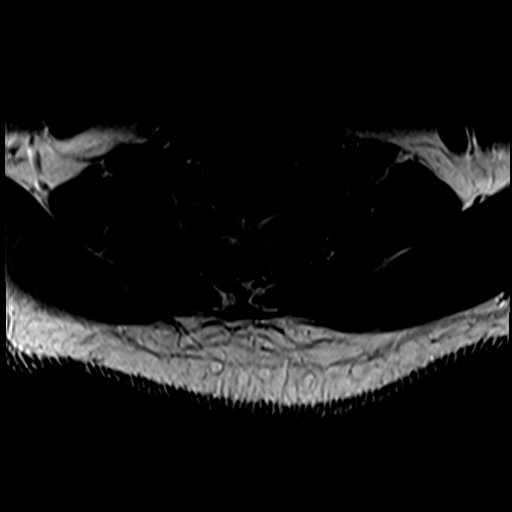
[im 18/32]
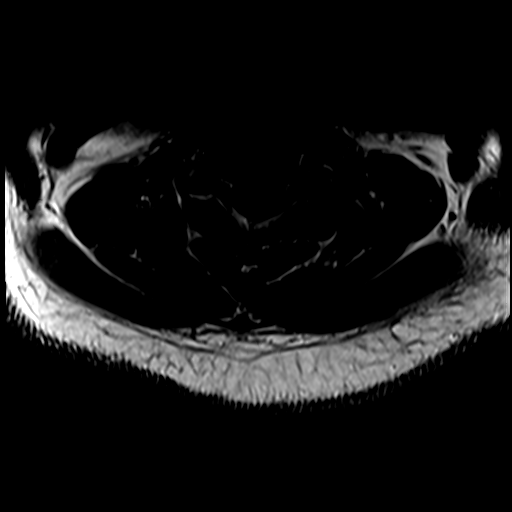

[29 of 48 positions shown; findings below may reference images not displayed]

FINDINGS: MRI CERVICAL SPINE FINDINGS

Alignment: Normal.

Vertebrae: No fracture, suspicious marrow lesion, or significant
marrow edema.

Cord: Normal signal and morphology. No abnormal intradural
enhancement.

Posterior Fossa, vertebral arteries, paraspinal tissues:
Unremarkable.

Disc levels:

Uncovertebral spurring results in severe left neural foraminal
stenosis at C2-3, mild right and mild-to-moderate left neural
foraminal stenosis at C3-4, moderate to severe bilateral neural
foraminal stenosis at C4-5, and moderate right and mild-to-moderate
left neural foraminal stenosis at C5-6. No spinal stenosis.

MRI THORACIC SPINE FINDINGS

Alignment:  Normal.

Vertebrae: No fracture, suspicious marrow lesion, or significant
marrow edema.

Cord: T2 hyperintense lesion in the right aspect of the cord at T1
extending over a craniocaudal distance of 1.7 cm with associated
mild cord expansion and enhancement. Nonenhancing T2 hyperintense
lesions in the cord at T8 and T10.

Paraspinal and other soft tissues: 3.1 cm T2 hyperintense/cystic
focus in the medial aspect of the spleen, benign in appearance.

Disc levels:

Right paracentral disc protrusions at T5-6, T6-7, and T7-8 and a
central disc protrusion at T12-L1 without compressive stenosis.
IMPRESSION: 1. Multiple T2 hyperintense lesions in the thoracic spinal cord
consistent with demyelinating disease including an active lesion at
T1.
2. No evidence of demyelinating disease in the cervical spine.
3. Cervical spondylosis with multilevel moderate to severe neural
foraminal stenosis.

## 2022-03-29 DIAGNOSIS — E559 Vitamin D deficiency, unspecified: Secondary | ICD-10-CM | POA: Insufficient documentation

## 2022-07-06 ENCOUNTER — Other Ambulatory Visit: Payer: Self-pay | Admitting: Nurse Practitioner

## 2022-07-06 NOTE — Telephone Encounter (Signed)
Requested Prescriptions  Pending Prescriptions Disp Refills   folic acid (FOLVITE) 1 MG tablet [Pharmacy Med Name: FOLIC ACID 1MG  TABLETS] 100 tablet 1    Sig: TAKE 1 TABLET(1 MG) BY MOUTH DAILY     Endocrinology:  Vitamins Passed - 07/06/2022 12:27 PM      Passed - Valid encounter within last 12 months    Recent Outpatient Visits           5 months ago Dyslipidemia   [place], FNP   1 year ago Dyslipidemia   [facility] Bo Merino, FNP   1 year ago Hospital discharge follow-up   [facility] Bo Merino, FNP   1 year ago Meralgia paresthetica of left side   [facility] at [place], Earley Abide, MD   1 year ago Meralgia paresthetica of left side   [facility] [place] [facility], Earley Abide, MD       Future Appointments             In 1 week Reece Packer, Myna Hidalgo, [facility], [facility]

## 2022-07-12 NOTE — Progress Notes (Unsigned)
There were no vitals taken for this visit.   Subjective:    Patient ID: Bill Perez, male    DOB: 1971/03/11, 52 y.o.   MRN: [id number]  HPI: Bill Perez is a 52 y.o. male, here alone  No chief complaint on file.  Hypertension: Patient's blood pressure today is ***.  Denies any shortness of breath, blurred vision, headaches or chest pain.  He currently takes lisinopril 5 mg daily.  DM2: His last A1c was 4.9 on 01/10/2022.  He is not on any medications for his diabetes.  It is controlled with diet.  He denies any polyuria, polydipsia or polyphagia.   Hyperlipidemia: He is currently taking rosuvastatin 5 mg daily.  He denies any myalgia.  His last LDL was 84 on 01/10/2022.  The 10-year ASCVD risk score (Arnett DK, et al., 2019) is: 30.7%   Values used to calculate the score:     Age: 11 years     Sex: Male     Is Non-Hispanic African American: Yes     Diabetic: Yes     Tobacco smoker: Yes     Systolic Blood Pressure: [id number] mmHg     Is BP treated: Yes     HDL Cholesterol: 27 mg/dL     Total Cholesterol: 141 mg/dL   Obesity: His weight today is ***BMI of ***.  Continues to work on lifestyle modification.  Chronic low back pain: Patient reports he has chronic low back pain.  He typically takes tizanidine for his pain.  Patient reports ***.   MS: Patient is currently seeing Dr. Alfonzo Feller of [facility] neurology. Patient recently had MRI of brain and c-spine that showed:Multifocal T2 FLAIR hyperintense signal abnormality within the supratentorial and infratentorial white matter, compatible with the provided history of multiple sclerosis. An 8 mm lesion within the anterior left temporal lobe periventricular white matter has increased in size since the prior brain MRI of 12/24/2020. The lesions otherwise appears stable in size and number. No pathologic intracranial enhancement to suggest active demyelination.   Generalized parenchymal atrophy, mild but advanced for age.    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   Mild paranasal sinus disease, as described.  1. Focal demyelinating lesions involving the left hemi cord at C4-5 and right hemicord at C7-T1. The lesion at the level of T1 has contracted and is decreased in size since previous exam. No new lesions to suggest disease progression. No evidence for active demyelination. 2. Underlying multilevel cervical spondylosis without significant spinal stenosis. Moderate to severe left C3 foraminal narrowing, with mild to moderate bilateral C4 through C6 foraminal narrowing as above.  Patient last saw neurology on 03/29/2022.  Patient reports ***.   Relevant past medical, surgical, family and social history reviewed and updated as indicated. Interim medical history since our last visit reviewed. Allergies and medications reviewed and updated.  Review of Systems  Constitutional: Negative for fever or weight change.  Respiratory: Negative for cough and shortness of breath.   Cardiovascular: Negative for chest pain or palpitations.  Gastrointestinal: Negative for abdominal pain, no bowel changes.  Musculoskeletal: positive for gait problem (using cane) or joint swelling.  Skin: Negative for rash.  Neurological: Negative for dizziness or headache.  No other specific complaints in a complete review of systems (except as listed in HPI above).      Objective:    There were no vitals taken for this visit.  Wt Readings from Last 3 Encounters:  01/10/22 227 lb 1.6 oz (103 kg)  07/17/21 240 lb (108.9 kg)  07/04/21 239 lb 12.8 oz (108.8 kg)    Physical Exam  Constitutional: Patient appears well-developed and well-nourished. Obese  No distress.  HEENT: head atraumatic, normocephalic, pupils equal and reactive to light,  neck supple Cardiovascular: Normal rate, regular rhythm and normal heart sounds.  No murmur heard. No BLE edema. Pulmonary/Chest: Effort normal  and breath sounds normal. No respiratory distress. Abdominal: Soft.  There is no tenderness. Psychiatric: Patient has a normal mood and affect. behavior is normal. Judgment and thought content normal.  Diabetic Foot Exam - Simple   No data filed     Results for orders placed or performed in visit on 01/10/22  Lipid panel  Result Value Ref Range   Cholesterol 141 <200 mg/dL   HDL 27 (L) > OR = 40 mg/dL   Triglycerides 204 (H) <150 mg/dL   LDL Cholesterol (Calc) 84 mg/dL (calc)   Total CHOL/HDL Ratio 5.2 (H) <5.0 (calc)   Non-HDL Cholesterol (Calc) 114 <130 mg/dL (calc)  CBC with Differential/Platelet  Result Value Ref Range   WBC 8.4 3.8 - 10.8 Thousand/uL   RBC 5.29 4.20 - 5.80 Million/uL   Hemoglobin 16.0 13.2 - 17.1 g/dL   HCT 48.0 38.5 - 50.0 %   MCV 90.7 80.0 - 100.0 fL   MCH 30.2 27.0 - 33.0 pg   MCHC 33.3 32.0 - 36.0 g/dL   RDW 11.9 11.0 - 15.0 %   Platelets 367 140 - 400 Thousand/uL   MPV 10.1 7.5 - 12.5 fL   Neutro Abs 5,267 1,500 - 7,800 cells/uL   Lymphs Abs 2,344 850 - 3,900 cells/uL   Absolute Monocytes 571 200 - 950 cells/uL   Eosinophils Absolute 168 15 - 500 cells/uL   Basophils Absolute 50 0 - 200 cells/uL   Neutrophils Relative % 62.7 %   Total Lymphocyte 27.9 %   Monocytes Relative 6.8 %   Eosinophils Relative 2.0 %   Basophils Relative 0.6 %  COMPLETE METABOLIC PANEL WITH GFR  Result Value Ref Range   Glucose, Bld 72 65 - 139 mg/dL   BUN 12 7 - 25 mg/dL   Creat 1.31 (H) 0.70 - 1.30 mg/dL   eGFR 66 > OR = 60 mL/min/1.52m2   BUN/Creatinine Ratio 9 6 - 22 (calc)   Sodium 138 135 - 146 mmol/L   Potassium 4.5 3.5 - 5.3 mmol/L   Chloride 106 98 - 110 mmol/L   CO2 23 20 - 32 mmol/L   Calcium 9.6 8.6 - 10.3 mg/dL   Total Protein 7.2 6.1 - 8.1 g/dL   Albumin 4.3 3.6 - 5.1 g/dL   Globulin 2.9 1.9 - 3.7 g/dL (calc)   AG Ratio 1.5 1.0 - 2.5 (calc)   Total Bilirubin 0.7 0.2 - 1.2 mg/dL   Alkaline phosphatase (APISO) 89 35 - 144 U/L   AST 17 10 - 35 U/L    ALT 16 9 - 46 U/L  Hemoglobin A1c  Result Value Ref Range   Hgb A1c MFr Bld 4.9 <5.7 % of total Hgb   Mean Plasma Glucose 94 mg/dL   eAG (mmol/L) 5.2 mmol/L  Microalbumin / creatinine urine ratio  Result Value Ref Range   Creatinine, Urine 231 20 - 320 mg/dL   Microalb, Ur 10.8 mg/dL   Microalb Creat Ratio 47 (H) <30 mcg/mg creat      Assessment & Plan:   Problem List Items Addressed This Visit  None    Follow up plan: No follow-ups on file.

## 2022-07-13 ENCOUNTER — Encounter: Payer: Self-pay | Admitting: Nurse Practitioner

## 2022-07-13 ENCOUNTER — Other Ambulatory Visit: Payer: Self-pay

## 2022-07-13 ENCOUNTER — Ambulatory Visit (INDEPENDENT_AMBULATORY_CARE_PROVIDER_SITE_OTHER): Payer: BC Managed Care – PPO | Admitting: Nurse Practitioner

## 2022-07-13 VITALS — BP 130/80 | HR 98 | Temp 98.5°F | Resp 16 | Ht 70.0 in | Wt 227.9 lb

## 2022-07-13 DIAGNOSIS — E1129 Type 2 diabetes mellitus with other diabetic kidney complication: Secondary | ICD-10-CM | POA: Diagnosis not present

## 2022-07-13 DIAGNOSIS — I10 Essential (primary) hypertension: Secondary | ICD-10-CM

## 2022-07-13 DIAGNOSIS — F17200 Nicotine dependence, unspecified, uncomplicated: Secondary | ICD-10-CM

## 2022-07-13 DIAGNOSIS — E785 Hyperlipidemia, unspecified: Secondary | ICD-10-CM | POA: Diagnosis not present

## 2022-07-13 DIAGNOSIS — Z6835 Body mass index (BMI) 35.0-35.9, adult: Secondary | ICD-10-CM

## 2022-07-13 DIAGNOSIS — M545 Low back pain, unspecified: Secondary | ICD-10-CM

## 2022-07-13 DIAGNOSIS — R809 Proteinuria, unspecified: Secondary | ICD-10-CM

## 2022-07-13 DIAGNOSIS — G35 Multiple sclerosis: Secondary | ICD-10-CM

## 2022-07-13 MED ORDER — ROSUVASTATIN CALCIUM 5 MG PO TABS: 5.0000 mg | ORAL_TABLET | Freq: Every day | ORAL | 3 refills | Status: DC

## 2022-07-13 MED ORDER — LISINOPRIL 2.5 MG PO TABS
ORAL_TABLET | ORAL | 3 refills | Status: DC
Start: 2022-07-13 — End: 2023-01-16

## 2022-07-13 MED ORDER — TIZANIDINE HCL 4 MG PO TABS
2.0000 mg | ORAL_TABLET | Freq: Three times a day (TID) | ORAL | 2 refills | Status: DC | PRN
Start: 2022-07-13 — End: 2023-01-16

## 2022-07-13 NOTE — Assessment & Plan Note (Addendum)
Doing well.  No changes continue taking Kesimta

## 2022-07-13 NOTE — Assessment & Plan Note (Signed)
Continue rosuvastatin 5 daily.

## 2022-07-13 NOTE — Assessment & Plan Note (Signed)
Continue taking lisinopril 5 mg daily.  Patient is doing well.

## 2022-07-13 NOTE — Assessment & Plan Note (Signed)
Continue taking tizanidine and gabapentin for your pain. 

## 2022-07-13 NOTE — Assessment & Plan Note (Signed)
Continue working on lifestyle modification.  

## 2022-07-13 NOTE — Assessment & Plan Note (Signed)
Diabetes is controlled with diet.  Patient is doing well.  Continue working on lifestyle modification

## 2022-07-14 LAB — LIPID PANEL
Cholesterol: 125 mg/dL (ref <200)
HDL: 31 mg/dL — ABNORMAL LOW (ref >=40)
LDL Cholesterol (Calc): 75 mg/dL (calc)
Non-HDL Cholesterol (Calc): 94 mg/dL (calc) (ref <130)
Total CHOL/HDL Ratio: 4 (calc) (ref <5.0)
Triglycerides: 105 mg/dL (ref <150)

## 2022-07-14 LAB — CBC WITH DIFFERENTIAL/PLATELET
Absolute Monocytes: 680 cells/uL (ref 200–950)
Basophils Absolute: 34 cells/uL (ref 0–200)
Basophils Relative: 0.4 %
Eosinophils Absolute: 202 cells/uL (ref 15–500)
Eosinophils Relative: 2.4 %
HCT: 48 % (ref 38.5–50.0)
Hemoglobin: 15.9 g/dL (ref 13.2–17.1)
Lymphs Abs: 1688.4 cells/uL (ref 850–3900)
MCH: 30.1 pg (ref 27.0–33.0)
MCHC: 33.1 g/dL (ref 32.0–36.0)
MCV: 90.9 fL (ref 80.0–100.0)
MPV: 9.8 fL (ref 7.5–12.5)
Monocytes Relative: 8.1 %
Neutro Abs: 5796 cells/uL (ref 1500–7800)
Neutrophils Relative %: 69 %
Platelets: 383 10*3/uL (ref 140–400)
RBC: 5.28 10*6/uL (ref 4.20–5.80)
RDW: 11.4 % (ref 11.0–15.0)
Total Lymphocyte: 20.1 %
WBC: 8.4 10*3/uL (ref 3.8–10.8)

## 2022-07-14 LAB — COMPLETE METABOLIC PANEL WITH GFR
AG Ratio: 1.4 (calc) (ref 1.0–2.5)
ALT: 17 U/L (ref 9–46)
AST: 15 U/L (ref 10–35)
Albumin: 4.3 g/dL (ref 3.6–5.1)
Alkaline phosphatase (APISO): 94 U/L (ref 35–144)
BUN/Creatinine Ratio: 8 (calc) (ref 6–22)
BUN: 10 mg/dL (ref 7–25)
CO2: 28 mmol/L (ref 20–32)
Calcium: 9.7 mg/dL (ref 8.6–10.3)
Chloride: 104 mmol/L (ref 98–110)
Creat: 1.33 mg/dL — ABNORMAL HIGH (ref 0.70–1.30)
Globulin: 3 g/dL (calc) (ref 1.9–3.7)
Glucose, Bld: 91 mg/dL (ref 65–99)
Potassium: 4.6 mmol/L (ref 3.5–5.3)
Sodium: 142 mmol/L (ref 135–146)
Total Bilirubin: 1.1 mg/dL (ref 0.2–1.2)
Total Protein: 7.3 g/dL (ref 6.1–8.1)
eGFR: 65 mL/min/{1.73_m2} (ref >=60)

## 2022-07-14 LAB — HEMOGLOBIN A1C
Hgb A1c MFr Bld: 5.2 % of total Hgb (ref <5.7)
Mean Plasma Glucose: 103 mg/dL
eAG (mmol/L): 5.7 mmol/L

## 2022-08-24 IMAGING — CR DG PELVIS 1-2V
1 series · 1 of 1 positions shown · non-contrast
Comparison: None.

CLINICAL DATA: Recent fall with pelvic pain, initial encounter

EXAM:
PELVIS - 1 VIEW

[dg pelvis 1-2 views]
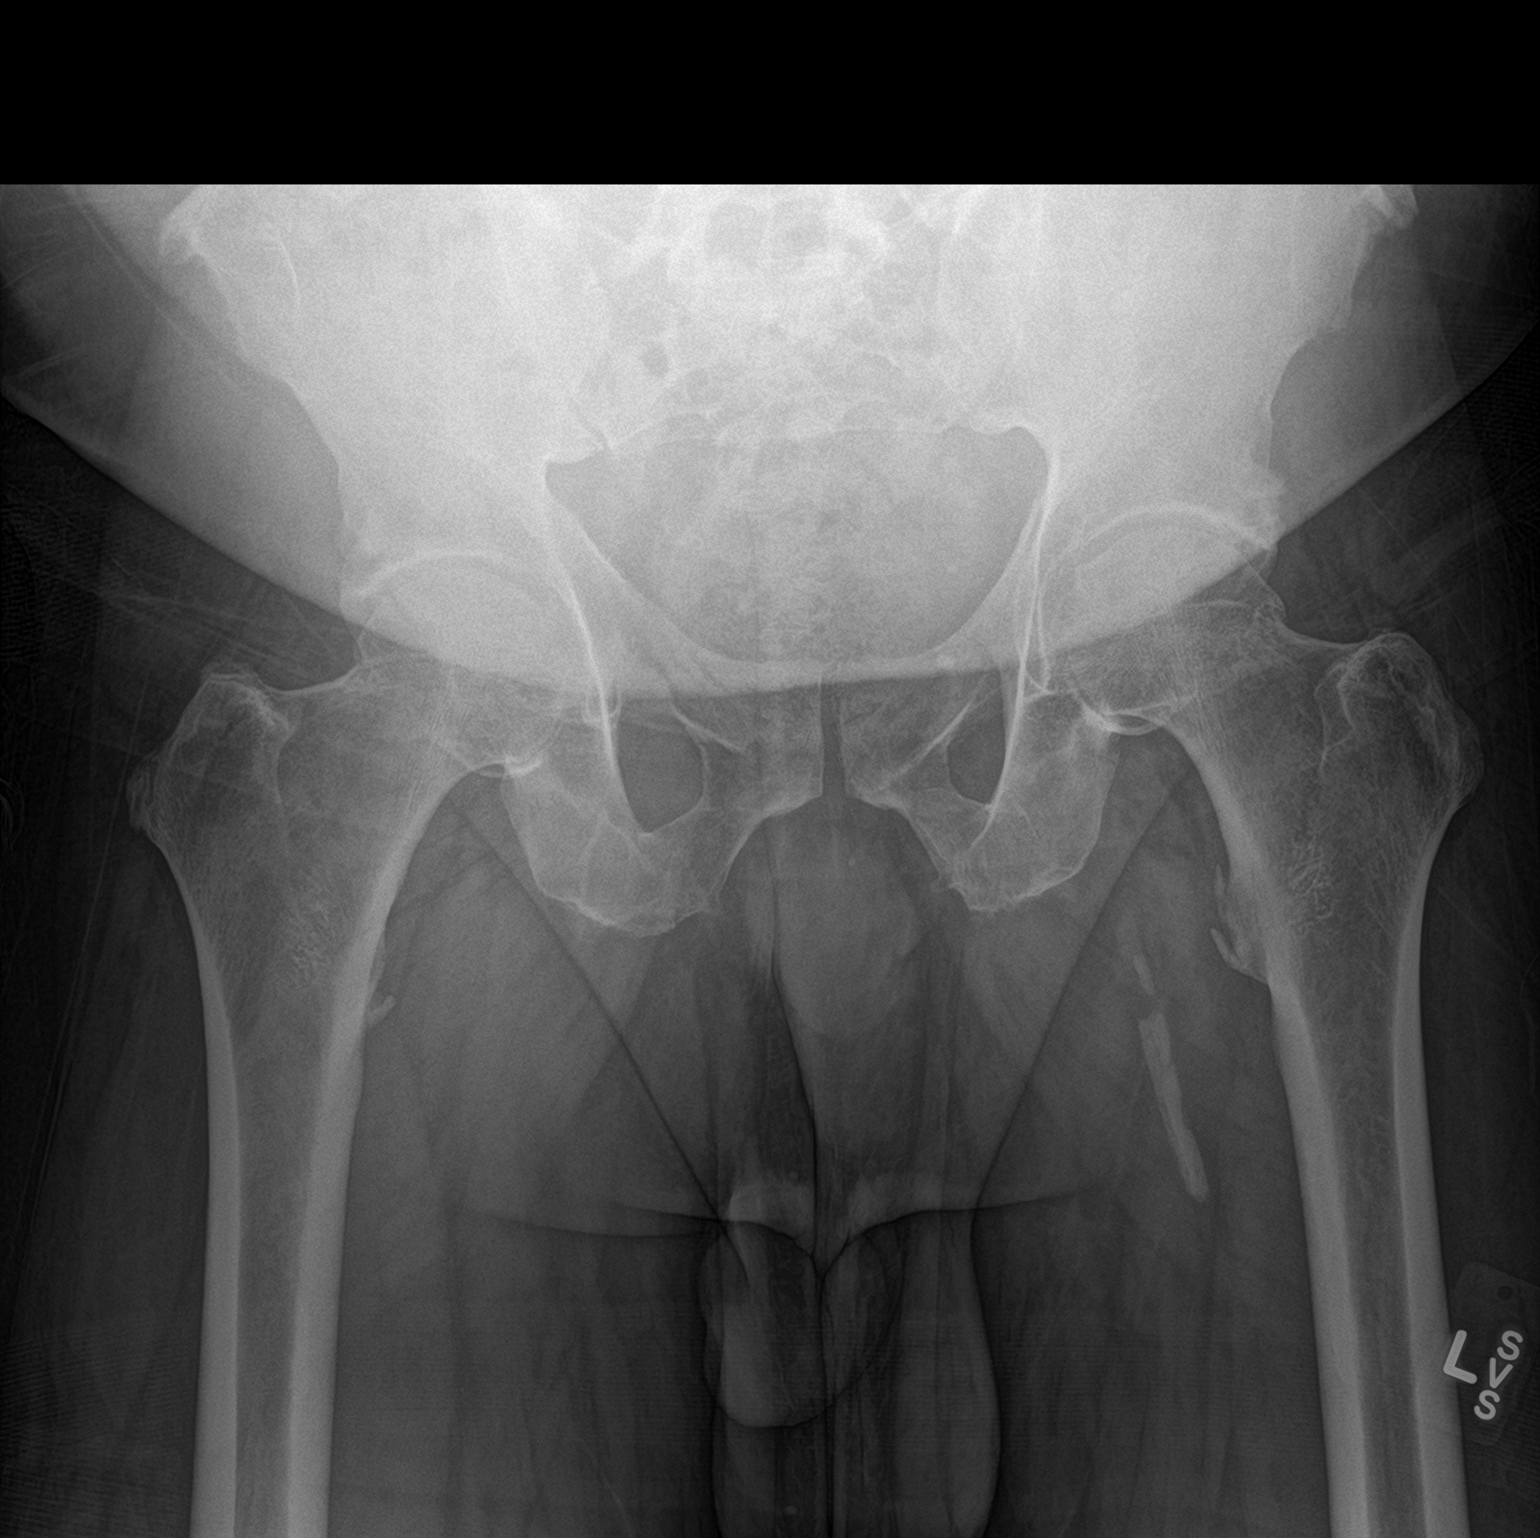

[1 of 1 positions shown; findings below may reference images not displayed]

FINDINGS: Pelvic ring is intact. No acute fracture or dislocation is noted.
Mild degenerative changes of the hip joints are seen. No soft tissue
abnormality is noted.
IMPRESSION: No acute abnormality seen.

## 2022-08-24 IMAGING — CR DG LUMBAR SPINE 2-3V
1 series · 2 of 2 positions shown · non-contrast
Comparison: None.

CLINICAL DATA: Recent fall with back pain, initial encounter

EXAM:
LUMBAR SPINE - 2 VIEW

[Series 1: dg lumbar spine 2-3 views · 0.14mm/px · 2 of 2 slices shown]
[im 1/2]
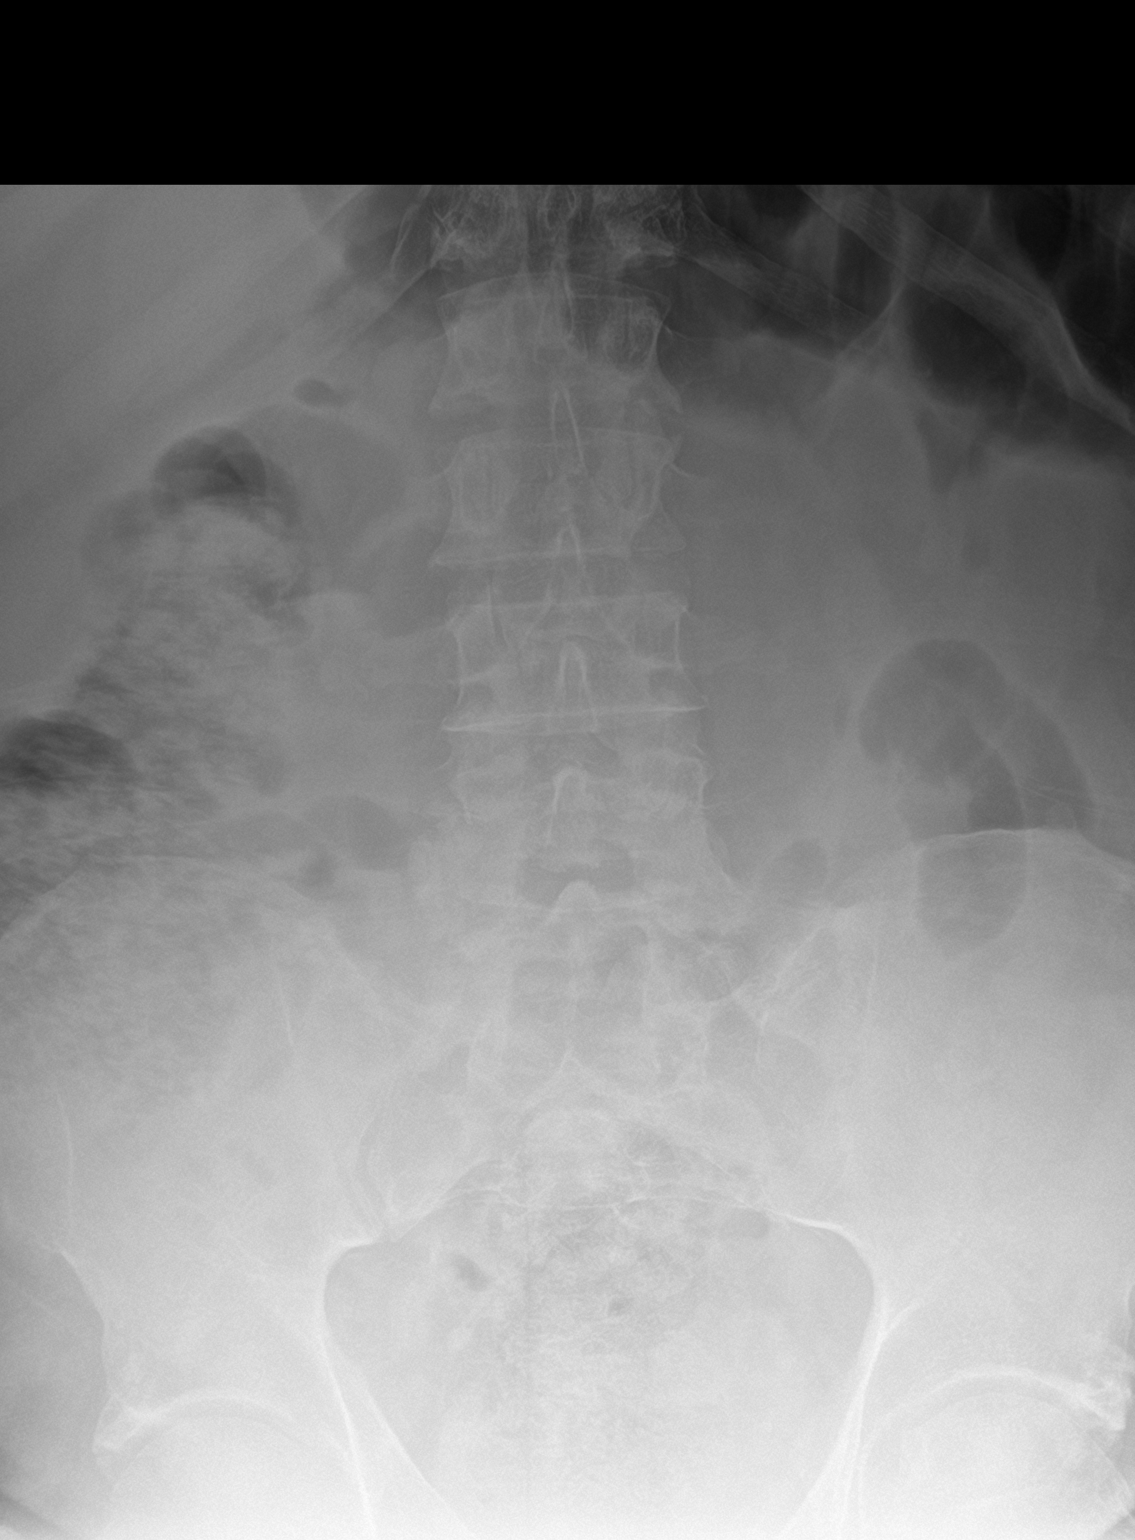
[im 2/2]
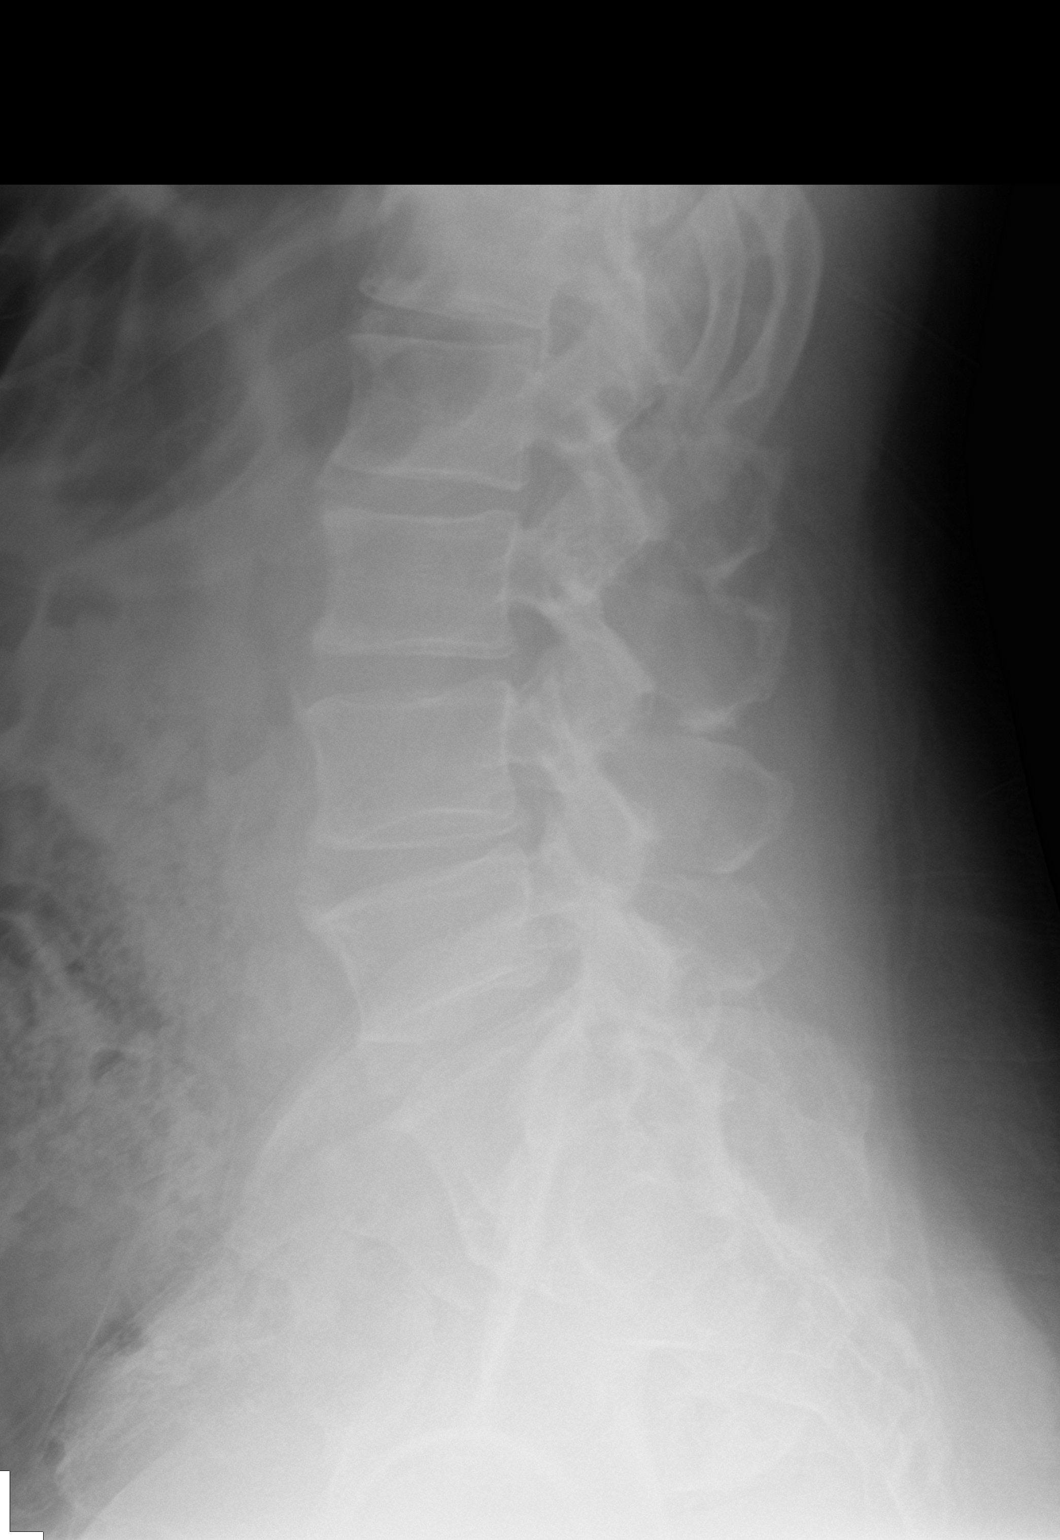

[2 of 2 positions shown; findings below may reference images not displayed]

FINDINGS: Five lumbar type vertebral bodies are well visualized. Vertebral
body height is well maintained. Mild osteophytic changes are seen.
No anterolisthesis is noted. No soft tissue abnormality is noted.
IMPRESSION: Mild degenerative change without acute abnormality.

## 2022-09-22 ENCOUNTER — Other Ambulatory Visit: Payer: Self-pay | Admitting: Nurse Practitioner

## 2022-09-22 DIAGNOSIS — E785 Hyperlipidemia, unspecified: Secondary | ICD-10-CM

## 2022-09-22 DIAGNOSIS — E1129 Type 2 diabetes mellitus with other diabetic kidney complication: Secondary | ICD-10-CM

## 2022-09-22 NOTE — Telephone Encounter (Signed)
Rx 07/13/22 #90 3RF- should be on file Requested Prescriptions  Pending Prescriptions Disp Refills   rosuvastatin (CRESTOR) 5 MG tablet [Pharmacy Med Name: ROSUVASTATIN 5MG  TABLETS] 90 tablet 3    Sig: TAKE 1 TABLET(5 MG) BY MOUTH AT BEDTIME     Cardiovascular:  Antilipid - Statins 2 Failed - 09/22/2022  2:30 PM      Failed - Cr in normal range and within 360 days    Creat  Date Value Ref Range Status  07/13/2022 1.33 (H) 0.70 - 1.30 mg/dL Final   Creatinine, Urine  Date Value Ref Range Status  01/10/2022 231 20 - 320 mg/dL Final         Failed - Lipid Panel in normal range within the last 12 months    Cholesterol  Date Value Ref Range Status  07/13/2022 125 <200 mg/dL Final   LDL Cholesterol (Calc)  Date Value Ref Range Status  07/13/2022 75 mg/dL (calc) Final    Comment:    Reference range: <100 . Desirable range <100 mg/dL for primary prevention;   <70 mg/dL for patients with CHD or diabetic patients  with > or = 2 CHD risk factors. Marland Kitchen LDL-C is now calculated using the Martin-Hopkins  calculation, which is a validated novel method providing  better accuracy than the Friedewald equation in the  estimation of LDL-C.  Horald Pollen et al. Lenox Ahr. 5366;440(34): 2061-2068  (http://education.[employer].com/faq/FAQ164)    HDL  Date Value Ref Range Status  07/13/2022 31 (L) > OR = 40 mg/dL Final   Triglycerides  Date Value Ref Range Status  07/13/2022 105 <150 mg/dL Final         Passed - Patient is not pregnant      Passed - Valid encounter within last 12 months    Recent Outpatient Visits           2 months ago Hypertension, unspecified type   [facility] Berniece Salines, FNP   8 months ago Dyslipidemia   [facility] Berniece Salines, FNP   1 year ago Dyslipidemia   [facility] Berniece Salines, FNP   1 year ago Hospital discharge follow-up   [facility]  Berniece Salines, FNP   1 year ago Meralgia paresthetica of left side   [facility] at [facility], Ocie Bob, MD       Future Appointments             In 3 months Zane Herald, Rudolpho Sevin, FNP [facility], [facility]

## 2022-10-27 ENCOUNTER — Encounter: Payer: Self-pay | Admitting: *Deleted

## 2022-11-13 ENCOUNTER — Other Ambulatory Visit: Payer: Self-pay | Admitting: Medical

## 2022-11-13 DIAGNOSIS — G35 Multiple sclerosis: Secondary | ICD-10-CM

## 2022-12-03 ENCOUNTER — Other Ambulatory Visit: Payer: Self-pay | Admitting: Nurse Practitioner

## 2022-12-03 DIAGNOSIS — I10 Essential (primary) hypertension: Secondary | ICD-10-CM

## 2022-12-03 DIAGNOSIS — E1129 Type 2 diabetes mellitus with other diabetic kidney complication: Secondary | ICD-10-CM

## 2022-12-05 NOTE — Telephone Encounter (Signed)
Last RF 07/13/22 #90 3 RF to requesting pharmacy  Requested Prescriptions  Refused Prescriptions Disp Refills   lisinopril (ZESTRIL) 2.5 MG tablet [Pharmacy Med Name: LISINOPRIL 2.5MG  TABLETS] 90 tablet 3    Sig: TAKE 1 TABLET(2.5 MG) BY MOUTH DAILY     Cardiovascular:  ACE Inhibitors Failed - 12/03/2022 10:22 AM      Failed - Cr in normal range and within 180 days    Creat  Date Value Ref Range Status  07/13/2022 1.33 (H) 0.70 - 1.30 mg/dL Final   Creatinine, Urine  Date Value Ref Range Status  01/10/2022 231 20 - 320 mg/dL Final         Passed - K in normal range and within 180 days    Potassium  Date Value Ref Range Status  07/13/2022 4.6 3.5 - 5.3 mmol/L Final         Passed - Patient is not pregnant      Passed - Last BP in normal range    BP Readings from Last 1 Encounters:  07/13/22 130/80         Passed - Valid encounter within last 6 months    Recent Outpatient Visits           4 months ago Hypertension, unspecified type   [facility] Berniece Salines, FNP   10 months ago Dyslipidemia   [facility] Berniece Salines, FNP   1 year ago Dyslipidemia   [facility] Berniece Salines, FNP   1 year ago Hospital discharge follow-up   [facility] Berniece Salines, FNP   1 year ago Meralgia paresthetica of left side   [facility] at [facility], Ocie Bob, MD       Future Appointments             In 1 month Zane Herald, Rudolpho Sevin, FNP [facility], [facility]

## 2023-01-02 ENCOUNTER — Ambulatory Visit: Payer: BC Managed Care – PPO

## 2023-01-05 ENCOUNTER — Ambulatory Visit
Admission: RE | Admit: 2023-01-05 | Discharge: 2023-01-05 | Disposition: A | Discharge status: Home / Self Care | Payer: BC Managed Care – PPO | Source: Ambulatory Visit | Attending: Medical | Admitting: Medical

## 2023-01-05 DIAGNOSIS — G35 Multiple sclerosis: Secondary | ICD-10-CM | POA: Insufficient documentation

## 2023-01-05 MED ORDER — GADOBUTROL 1 MMOL/ML IV SOLN
10.0000 mL | Freq: Once | INTRAVENOUS | Status: AC | PRN
  Administered 2023-01-05: 10 mL via INTRAVENOUS

## 2023-01-16 ENCOUNTER — Encounter: Payer: Self-pay | Admitting: Nurse Practitioner

## 2023-01-16 ENCOUNTER — Ambulatory Visit
Admission: RE | Admit: 2023-01-16 | Discharge: 2023-01-16 | Disposition: A | Discharge status: Home / Self Care | Payer: BC Managed Care – PPO | Source: Ambulatory Visit | Attending: Nurse Practitioner | Admitting: Nurse Practitioner

## 2023-01-16 ENCOUNTER — Other Ambulatory Visit: Payer: Self-pay

## 2023-01-16 ENCOUNTER — Ambulatory Visit
Admission: RE | Admit: 2023-01-16 | Discharge: 2023-01-16 | Disposition: A | Discharge status: Home / Self Care | Payer: BC Managed Care – PPO | Attending: Nurse Practitioner | Admitting: Nurse Practitioner

## 2023-01-16 ENCOUNTER — Ambulatory Visit (INDEPENDENT_AMBULATORY_CARE_PROVIDER_SITE_OTHER): Payer: BC Managed Care – PPO | Admitting: Nurse Practitioner

## 2023-01-16 VITALS — BP 124/82 | HR 96 | Temp 97.9°F | Resp 16 | Ht 70.0 in | Wt 212.8 lb

## 2023-01-16 DIAGNOSIS — E1129 Type 2 diabetes mellitus with other diabetic kidney complication: Secondary | ICD-10-CM | POA: Diagnosis not present

## 2023-01-16 DIAGNOSIS — Z6835 Body mass index (BMI) 35.0-35.9, adult: Secondary | ICD-10-CM

## 2023-01-16 DIAGNOSIS — E785 Hyperlipidemia, unspecified: Secondary | ICD-10-CM | POA: Diagnosis not present

## 2023-01-16 DIAGNOSIS — M545 Low back pain, unspecified: Secondary | ICD-10-CM

## 2023-01-16 DIAGNOSIS — G35 Multiple sclerosis: Secondary | ICD-10-CM

## 2023-01-16 DIAGNOSIS — R634 Abnormal weight loss: Secondary | ICD-10-CM

## 2023-01-16 DIAGNOSIS — E66812 Obesity, class 2: Secondary | ICD-10-CM

## 2023-01-16 DIAGNOSIS — I10 Essential (primary) hypertension: Secondary | ICD-10-CM | POA: Diagnosis not present

## 2023-01-16 DIAGNOSIS — R809 Proteinuria, unspecified: Secondary | ICD-10-CM

## 2023-01-16 MED ORDER — ROSUVASTATIN CALCIUM 5 MG PO TABS
5.0000 mg | ORAL_TABLET | Freq: Every day | ORAL | 3 refills | Status: DC
Start: 2023-01-16 — End: 2023-07-18

## 2023-01-16 MED ORDER — METHOCARBAMOL 500 MG PO TABS
500.0000 mg | ORAL_TABLET | Freq: Two times a day (BID) | ORAL | 3 refills | Status: DC | PRN
Start: 2023-01-16 — End: 2023-03-02

## 2023-01-16 MED ORDER — LISINOPRIL 2.5 MG PO TABS
ORAL_TABLET | ORAL | 3 refills | Status: DC
Start: 2023-01-16 — End: 2023-07-18

## 2023-01-16 NOTE — Progress Notes (Addendum)
BP 124/82   Pulse 96   Temp 97.9 F (36.6 C) (Oral)   Resp 16   Ht 5\' 10"  (1.778 m)   Wt 212 lb 12.8 oz (96.5 kg)   SpO2 98%   BMI 30.53 kg/m    Subjective:    Patient ID: Bill Perez, male    DOB: 30-Sep-1970, 52 y.o.   MRN: [id number]  HPI: Bill Perez is a 52 y.o. male, here alone  Chief Complaint  Patient presents with   Medical Management of Chronic Issues    6 month recheck   Hypertension:  -Medications: lisinopril 2.5 mg daily -Patient is compliant with above medications and reports no side effects. -Checking BP at home (average): 120s -Denies any SOB, CP, vision changes, LE edema or symptoms of hypotension -Diet: recommend DASH diet  -Exercise: recommend 150 min of physical activity weekly     Diabetes, Type 2:  -Last A1c 5.2 -Medications: controlled with diet -Diet: reduce sugar and processed foods in your diet  -Exercise: recommend 150 min of physical activity weekly   -Eye exam: due -Foot exam: due -Microalbumin: due -Statin: yes -PNA vaccine: no -Denies symptoms of hypoglycemia, polyuria, polydipsia, numbness extremities, foot ulcers/trauma.    HLD:  -Medications: rosuvastatin 5 mg daily -Patient is compliant with above medications and reports no side effects.  -Last lipid panel:  Lipid Panel     Component Value Date/Time   CHOL 125 07/13/2022 0930   TRIG 105 07/13/2022 0930   HDL 31 (L) 07/13/2022 0930   CHOLHDL 4.0 07/13/2022 0930   VLDL 22 09/13/2016 1031   LDLCALC 75 07/13/2022 0930      Obesity:  Current weight : 212 lbs BMI: 30.53 previous weight:227 lbs Treatment Tried: lifestyle modification Comorbidities: HTN, HLD, MS    Chronic low back pain: Patient reports he has chronic low back pain.  Patient reports he is doing well. He says that the tizanidine was expensive, will try robaxin.   MS: Patient is currently seeing Dr. Noralee Chars of [facility] neurology. Patient recently had MRI of brain and c-spine that  showed:Multifocal T2 FLAIR hyperintense signal abnormality within the supratentorial and infratentorial white matter, compatible with the provided history of multiple sclerosis. An 8 mm lesion within the anterior left temporal lobe periventricular white matter has increased in size since the prior brain MRI of 12/24/2020. The lesions otherwise appears stable in size and number. No pathologic intracranial enhancement to suggest active demyelination.   Generalized parenchymal atrophy, mild but advanced for age.   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   Mild paranasal sinus disease, as described.  1. Focal demyelinating lesions involving the left hemi cord at C4-5 and right hemicord at C7-T1. The lesion at the level of T1 has contracted and is decreased in size since previous exam. No new lesions to suggest disease progression. No evidence for active demyelination. 2. Underlying multilevel cervical spondylosis without significant spinal stenosis. Moderate to severe left C3 foraminal narrowing, with mild to moderate bilateral C4 through C6 foraminal narrowing as above.  Patient last saw neurology on 11/06/2022. Doing well continues on Kesimpta   Weight loss: last appointment he was 227 lbs today he is 212 lbs.  He says that last week he weight 202 lbs.  He denies any blood in his stool.  He says he has not had a loss of appetite.   Up to date on colon cancer screening,  due for PSA. He is  a smoker will get chest xray. Will also get stool cards.   Relevant past medical, surgical, family and social history reviewed and updated as indicated. Interim medical history since our last visit reviewed. Allergies and medications reviewed and updated.  Review of Systems  Constitutional: Negative for fever, positive for weight change.  Respiratory: Negative for cough and shortness of breath.   Cardiovascular: Negative for chest pain or palpitations.   Gastrointestinal: Negative for abdominal pain, no bowel changes.  Musculoskeletal: positive for gait problem (using cane) or joint swelling.  Skin: Negative for rash.  Neurological: Negative for dizziness or headache.  No other specific complaints in a complete review of systems (except as listed in HPI above).      Objective:    BP 124/82   Pulse 96   Temp 97.9 F (36.6 C) (Oral)   Resp 16   Ht 5\' 10"  (1.778 m)   Wt 212 lb 12.8 oz (96.5 kg)   SpO2 98%   BMI 30.53 kg/m   Wt Readings from Last 3 Encounters:  01/16/23 212 lb 12.8 oz (96.5 kg)  07/13/22 227 lb 14.4 oz (103.4 kg)  01/10/22 227 lb 1.6 oz (103 kg)    Physical Exam  Constitutional: Patient appears well-developed and well-nourished. Obese  No distress.  HEENT: head atraumatic, normocephalic, pupils equal and reactive to light,  neck supple Cardiovascular: Normal rate, regular rhythm and normal heart sounds.  No murmur heard. No BLE edema. Pulmonary/Chest: Effort normal and breath sounds normal. No respiratory distress. Abdominal: Soft.  There is no tenderness. Psychiatric: Patient has a normal mood and affect. behavior is normal. Judgment and thought content normal.  Diabetic Foot Exam - Simple   Simple Foot Form Diabetic Foot exam was performed with the following findings: Yes 01/16/2023 11:13 AM  Visual Inspection No deformities, no ulcerations, no other skin breakdown bilaterally: Yes Sensation Testing Intact to touch and monofilament testing bilaterally: Yes Pulse Check Posterior Tibialis and Dorsalis pulse intact bilaterally: Yes Comments     Results for orders placed or performed in visit on 07/13/22  COMPLETE METABOLIC PANEL WITH GFR  Result Value Ref Range   Glucose, Bld 91 65 - 99 mg/dL   BUN 10 7 - 25 mg/dL   Creat 1.61 (H) 0.96 - 1.30 mg/dL   eGFR 65 > OR = 60 EA/VWU/[id number]   BUN/Creatinine Ratio 8 6 - 22 (calc)   Sodium 142 135 - 146 mmol/L   Potassium 4.6 3.5 - 5.3 mmol/L   Chloride 104 98  - 110 mmol/L   CO2 28 20 - 32 mmol/L   Calcium 9.7 8.6 - 10.3 mg/dL   Total Protein 7.3 6.1 - 8.1 g/dL   Albumin 4.3 3.6 - 5.1 g/dL   Globulin 3.0 1.9 - 3.7 g/dL (calc)   AG Ratio 1.4 1.0 - 2.5 (calc)   Total Bilirubin 1.1 0.2 - 1.2 mg/dL   Alkaline phosphatase (APISO) 94 35 - 144 U/L   AST 15 10 - 35 U/L   ALT 17 9 - 46 U/L  CBC with Differential/Platelet  Result Value Ref Range   WBC 8.4 3.8 - 10.8 Thousand/uL   RBC 5.28 4.20 - 5.80 Million/uL   Hemoglobin 15.9 13.2 - 17.1 g/dL   HCT 14.7 82.9 - 56.2 %   MCV 90.9 80.0 - 100.0 fL   MCH 30.1 27.0 - 33.0 pg   MCHC 33.1 32.0 - 36.0 g/dL   RDW 13.0 86.5 - 78.4 %   Platelets 383 140 -  400 Thousand/uL   MPV 9.8 7.5 - 12.5 fL   Neutro Abs 5,796 1,500 - 7,800 cells/uL   Lymphs Abs 1,688.4 850 - 3,900 cells/uL   Absolute Monocytes 680 200 - 950 cells/uL   Eosinophils Absolute 202 15 - 500 cells/uL   Basophils Absolute 34 0 - 200 cells/uL   Neutrophils Relative % 69 %   Total Lymphocyte 20.1 %   Monocytes Relative 8.1 %   Eosinophils Relative 2.4 %   Basophils Relative 0.4 %  Lipid panel  Result Value Ref Range   Cholesterol 125 <200 mg/dL   HDL 31 (L) > OR = 40 mg/dL   Triglycerides 161 <096 mg/dL   LDL Cholesterol (Calc) 75 mg/dL (calc)   Total CHOL/HDL Ratio 4.0 <5.0 (calc)   Non-HDL Cholesterol (Calc) 94 <045 mg/dL (calc)  Hemoglobin W0J  Result Value Ref Range   Hgb A1c MFr Bld 5.2 <5.7 % of total Hgb   Mean Plasma Glucose 103 mg/dL   eAG (mmol/L) 5.7 mmol/L      Assessment & Plan:   Problem List Items Addressed This Visit       Cardiovascular and Mediastinum   Hypertension - Primary    Continue lisinopril       Relevant Medications   rosuvastatin (CRESTOR) 5 MG tablet   lisinopril (ZESTRIL) 2.5 MG tablet   Other Relevant Orders   CBC with Differential/Platelet   COMPLETE METABOLIC PANEL WITH GFR     Endocrine   Type 2 diabetes mellitus with microalbuminuria, without long-term current use of insulin (HCC)     Not currently on medication, diet controlled.       Relevant Medications   rosuvastatin (CRESTOR) 5 MG tablet   lisinopril (ZESTRIL) 2.5 MG tablet   Other Relevant Orders   COMPLETE METABOLIC PANEL WITH GFR   Hemoglobin A1c   Microalbumin / creatinine urine ratio   HM Diabetes Foot Exam (Completed)     Nervous and Auditory   MS (multiple sclerosis) (HCC)    Doing well, managed by neurology      Relevant Medications   KESIMPTA 20 MG/0.4ML SOAJ     Other   Dyslipidemia    Continue rosuvastatin 5 mg daily      Relevant Medications   rosuvastatin (CRESTOR) 5 MG tablet   Other Relevant Orders   Lipid panel   COMPLETE METABOLIC PANEL WITH GFR   Class 2 severe obesity due to excess calories with serious comorbidity and body mass index (BMI) of 35.0 to 35.9 in adult [facility])    Recent unintentional weightloss      Left low back pain    Tizanidine was too expensive, will send in robaxin      Relevant Medications   methocarbamol (ROBAXIN) 500 MG tablet   Other Visit Diagnoses     Weight loss       unintentional weight loss, will check labs, chest xray and stool cards x3.   Relevant Orders   CBC with Differential/Platelet   COMPLETE METABOLIC PANEL WITH GFR   Hemoglobin A1c   TSH   C-reactive protein   Sedimentation rate   PSA   HIV Antibody (routine testing w rflx)   Hepatitis C antibody   DG Chest 2 View         Follow up plan: Return in about 6 months (around 07/17/2023) for follow up.

## 2023-01-16 NOTE — Assessment & Plan Note (Signed)
Not currently on medication, diet controlled.

## 2023-01-16 NOTE — Addendum Note (Signed)
Addended by: Della Goo F on: 01/16/2023 11:38 AM   Modules accepted: Orders

## 2023-01-16 NOTE — Assessment & Plan Note (Signed)
Recent unintentional weightloss

## 2023-01-16 NOTE — Assessment & Plan Note (Signed)
Doing well, managed by neurology

## 2023-01-16 NOTE — Assessment & Plan Note (Signed)
Tizanidine was too expensive, will send in robaxin

## 2023-01-16 NOTE — Assessment & Plan Note (Signed)
Continue rosuvastatin 5 mg daily. ?

## 2023-01-16 NOTE — Assessment & Plan Note (Signed)
Continue lisinopril

## 2023-01-17 LAB — LIPID PANEL
Cholesterol: 126 mg/dL (ref <200)
HDL: 35 mg/dL — ABNORMAL LOW (ref >=40)
LDL Cholesterol (Calc): 75 mg/dL
Non-HDL Cholesterol (Calc): 91 mg/dL (ref <130)
Total CHOL/HDL Ratio: 3.6 (calc) (ref <5.0)
Triglycerides: 81 mg/dL (ref <150)

## 2023-01-17 LAB — COMPLETE METABOLIC PANEL WITH GFR
AG Ratio: 1.6 (calc) (ref 1.0–2.5)
ALT: 18 U/L (ref 9–46)
AST: 18 U/L (ref 10–35)
Albumin: 4.5 g/dL (ref 3.6–5.1)
Alkaline phosphatase (APISO): 98 U/L (ref 35–144)
BUN: 14 mg/dL (ref 7–25)
CO2: 30 mmol/L (ref 20–32)
Calcium: 9.7 mg/dL (ref 8.6–10.3)
Chloride: 104 mmol/L (ref 98–110)
Creat: 1.23 mg/dL (ref 0.70–1.30)
Globulin: 2.8 g/dL (ref 1.9–3.7)
Glucose, Bld: 90 mg/dL (ref 65–99)
Potassium: 5 mmol/L (ref 3.5–5.3)
Sodium: 140 mmol/L (ref 135–146)
Total Bilirubin: 0.8 mg/dL (ref 0.2–1.2)
Total Protein: 7.3 g/dL (ref 6.1–8.1)
eGFR: 71 mL/min/{1.73_m2} (ref >=60)

## 2023-01-17 LAB — MICROALBUMIN / CREATININE URINE RATIO
Creatinine, Urine: 212 mg/dL (ref 20–320)
Microalb Creat Ratio: 35 mg/g{creat} — ABNORMAL HIGH (ref <30)
Microalb, Ur: 7.4 mg/dL

## 2023-01-17 LAB — CBC WITH DIFFERENTIAL/PLATELET
Absolute Monocytes: 443 {cells}/uL (ref 200–950)
Basophils Absolute: 41 {cells}/uL (ref 0–200)
Basophils Relative: 0.7 %
Eosinophils Absolute: 183 {cells}/uL (ref 15–500)
Eosinophils Relative: 3.1 %
HCT: 47.6 % (ref 38.5–50.0)
Hemoglobin: 15.3 g/dL (ref 13.2–17.1)
Lymphs Abs: 1717 {cells}/uL (ref 850–3900)
MCH: 30.1 pg (ref 27.0–33.0)
MCHC: 32.1 g/dL (ref 32.0–36.0)
MCV: 93.5 fL (ref 80.0–100.0)
MPV: 9.8 fL (ref 7.5–12.5)
Monocytes Relative: 7.5 %
Neutro Abs: 3516 {cells}/uL (ref 1500–7800)
Neutrophils Relative %: 59.6 %
Platelets: 371 10*3/uL (ref 140–400)
RBC: 5.09 10*6/uL (ref 4.20–5.80)
RDW: 11.4 % (ref 11.0–15.0)
Total Lymphocyte: 29.1 %
WBC: 5.9 10*3/uL (ref 3.8–10.8)

## 2023-01-17 LAB — HEMOGLOBIN A1C
Hgb A1c MFr Bld: 4.9 %{Hb} (ref <5.7)
Mean Plasma Glucose: 94 mg/dL
eAG (mmol/L): 5.2 mmol/L

## 2023-01-17 LAB — TSH: TSH: 0.95 m[IU]/L (ref 0.40–4.50)

## 2023-01-17 LAB — C-REACTIVE PROTEIN: CRP: <3 mg/L (ref <8.0)

## 2023-01-17 LAB — HIV ANTIBODY (ROUTINE TESTING W REFLEX): HIV 1&2 Ab, 4th Generation: NONREACTIVE

## 2023-01-17 LAB — PSA: PSA: 0.26 ng/mL (ref <=4.00)

## 2023-01-17 LAB — SEDIMENTATION RATE: Sed Rate: 2 mm/h (ref 0–20)

## 2023-01-22 ENCOUNTER — Ambulatory Visit (INDEPENDENT_AMBULATORY_CARE_PROVIDER_SITE_OTHER): Payer: BC Managed Care – PPO

## 2023-01-22 ENCOUNTER — Other Ambulatory Visit: Payer: Self-pay | Admitting: Nurse Practitioner

## 2023-01-22 DIAGNOSIS — R634 Abnormal weight loss: Secondary | ICD-10-CM

## 2023-01-22 DIAGNOSIS — R195 Other fecal abnormalities: Secondary | ICD-10-CM

## 2023-01-22 LAB — POC HEMOCCULT BLD/STL (HOME/3-CARD/SCREEN)
Card #2 Fecal Occult Blod, POC: POSITIVE
Card #3 Fecal Occult Blood, POC: NEGATIVE
Fecal Occult Blood, POC: POSITIVE — AB

## 2023-02-20 ENCOUNTER — Other Ambulatory Visit: Payer: Self-pay | Admitting: Nurse Practitioner

## 2023-02-20 DIAGNOSIS — G5712 Meralgia paresthetica, left lower limb: Secondary | ICD-10-CM

## 2023-02-20 NOTE — Telephone Encounter (Signed)
Medication Refill -  Most Recent Primary Care Visit:  Provider: Dollene Primrose  Department: [facility]  Visit Type: NURSE VISIT  Date: 01/22/2023  Medication: gabapentin (NEURONTIN) 100 MG capsule   Has the patient contacted their pharmacy? yes  (Agent: If yes, when and what did the pharmacy advise?)contact pcp  Is this the correct pharmacy for this prescription? yes  This is the patient's preferred pharmacy:  [id number] - [place], [place] - [address] AT [facility] OF [address] [facility] [address] [place] [address] Phone: [phone] Fax: [phone]   Has the prescription been filled recently? no  Is the patient out of the medication? yes  Has the patient been seen for an appointment in the last year OR does the patient have an upcoming appointment? yes  Can we respond through MyChart? yes  Agent: Please be advised that Rx refills may take up to 3 business days. We ask that you follow-up with your pharmacy.

## 2023-02-21 ENCOUNTER — Other Ambulatory Visit: Payer: Self-pay

## 2023-02-21 DIAGNOSIS — G5712 Meralgia paresthetica, left lower limb: Secondary | ICD-10-CM

## 2023-02-21 NOTE — Telephone Encounter (Signed)
Requested medication (s) are due for refill today- yes  Requested medication (s) are on the active medication list -yes  Future visit scheduled -yes  Last refill: 01/03/21 #90 11RF  Notes to clinic: outside provider Rx- possibly expired Rx-sent for review   Requested Prescriptions  Pending Prescriptions Disp Refills   gabapentin (NEURONTIN) 100 MG capsule 90 capsule 11    Sig: Three tabs PO qHS for a week, then four for a week, then five.     Neurology: Anticonvulsants - gabapentin Passed - 02/20/2023  4:52 PM      Passed - Cr in normal range and within 360 days    Creat  Date Value Ref Range Status  01/16/2023 1.23 0.70 - 1.30 mg/dL Final   Creatinine, Urine  Date Value Ref Range Status  01/16/2023 212 20 - 320 mg/dL Final         Passed - Completed PHQ-2 or PHQ-9 in the last 360 days      Passed - Valid encounter within last 12 months    Recent Outpatient Visits           1 month ago Hypertension, unspecified type   [facility] Berniece Salines, FNP   7 months ago Hypertension, unspecified type   [facility] Berniece Salines, FNP   1 year ago Dyslipidemia   [facility] Berniece Salines, FNP   1 year ago Dyslipidemia   [facility] Berniece Salines, FNP   2 years ago Hospital discharge follow-up   [facility] Berniece Salines, FNP       Future Appointments             In 4 months Berniece Salines, FNP [facility], [facility]               Requested Prescriptions  Pending Prescriptions Disp Refills   gabapentin (NEURONTIN) 100 MG capsule 90 capsule 11    Sig: Three tabs PO qHS for a week, then four for a week, then five.     Neurology: Anticonvulsants - gabapentin Passed - 02/20/2023  4:52 PM      Passed - Cr in normal range and within 360 days    Creat  Date Value Ref Range Status  01/16/2023 1.23 0.70  - 1.30 mg/dL Final   Creatinine, Urine  Date Value Ref Range Status  01/16/2023 212 20 - 320 mg/dL Final         Passed - Completed PHQ-2 or PHQ-9 in the last 360 days      Passed - Valid encounter within last 12 months    Recent Outpatient Visits           1 month ago Hypertension, unspecified type   [facility] Berniece Salines, FNP   7 months ago Hypertension, unspecified type   [facility] Berniece Salines, FNP   1 year ago Dyslipidemia   [facility] Berniece Salines, FNP   1 year ago Dyslipidemia   [facility] Berniece Salines, FNP   2 years ago Hospital discharge follow-up   [facility] Berniece Salines, FNP       Future Appointments             In 4 months Zane Herald, Rudolpho Sevin, FNP [facility], [facility]

## 2023-02-22 MED ORDER — GABAPENTIN 100 MG PO CAPS
ORAL_CAPSULE | ORAL | 11 refills | Status: DC
Start: 2023-02-22 — End: 2023-07-18

## 2023-03-01 NOTE — Progress Notes (Signed)
Celso Amy, PA-C [address]  [address]  [place], [place] [address]  Main: [phone]  Fax: [phone]   Gastroenterology Consultation  Referring Provider:     Berniece Salines, FNP Primary Care Physician:  Berniece Salines, FNP Primary Gastroenterologist:  Celso Amy, PA-C / Dr. Midge Minium   Reason for Consultation:     Weight loss, positive FOBT        HPI:   Bill Perez is a 52 y.o. y/o male referred for consultation & management  by Berniece Salines, FNP.  Referred for weight loss and positive FOBT.  Weight has dropped from 227lb to 212lb in the past year.  Weight is down 15 pounds in the past year.  He saw his PCP 1 month ago to evaluate weight loss.  Had stool cards which were Hemoccult positive.  Normal chest x-ray.  He denies any GI symptoms such as abdominal pain, heartburn, dysphagia, diarrhea, rectal bleeding, or black stools.  Has mild occasional constipation.  Denies family history of colon cancer.    No previous GI evaluation, EGD, or colonoscopy.  He is overdue for his for screening colonoscopy.  Labs 01/16/2023: Normal CMP, TSH and CBC.  Hemoglobin 15.3.  Past Medical History:  Diagnosis Date   Hyperlipidemia    Hypertension    Multiple sclerosis (HCC)     History reviewed. No pertinent surgical history.  Prior to Admission medications   Medication Sig Start Date End Date Taking? Authorizing Provider  aspirin 81 MG tablet Take 81 mg by mouth daily.    [provider]  folic acid (FOLVITE) 1 MG tablet TAKE 1 TABLET(1 MG) BY MOUTH DAILY 07/06/22   Berniece Salines, FNP  gabapentin (NEURONTIN) 100 MG capsule Three tabs PO qHS for a week, then four for a week, then five. 02/22/23   Berniece Salines, FNP  KESIMPTA 20 MG/0.4ML SOAJ Inject into the skin.    [provider]  lisinopril (ZESTRIL) 2.5 MG tablet TAKE 1 TABLET(2.5 MG) BY MOUTH DAILY 01/16/23   Berniece Salines, FNP  methocarbamol (ROBAXIN) 500 MG tablet Take 1 tablet (500  mg total) by mouth 2 (two) times daily as needed for muscle spasms. 01/16/23   Berniece Salines, FNP  rosuvastatin (CRESTOR) 5 MG tablet Take 1 tablet (5 mg total) by mouth at bedtime. 01/16/23   Berniece Salines, FNP    Family History  Problem Relation Age of Onset   Hypertension Mother    Hyperlipidemia Mother    Stroke Father    Diabetes Father      Social History   Tobacco Use   Smoking status: Every Day    Current packs/day: 0.50    Average packs/day: 0.5 packs/day for 25.0 years (12.5 ttl pk-yrs)    Types: Cigarettes   Smokeless tobacco: Never  Vaping Use   Vaping status: Never Used  Substance Use Topics   Alcohol use: Not Currently    Alcohol/week: 4.0 standard drinks of alcohol    Types: 4 Glasses of wine per week   Drug use: Not Currently    Types: Marijuana    Allergies as of 03/02/2023   (No Known Allergies)    Review of Systems:    All systems reviewed and negative except where noted in HPI.   Physical Exam:  BP 125/79   Pulse 74   Temp 98.2 F (36.8 C)   Ht 5\' 10"  (1.778 m)   Wt 216 lb (98 kg)  BMI 30.99 kg/m  No LMP for male patient.  General:   Alert,  Well-developed, well-nourished, pleasant and cooperative in NAD Lungs:  Respirations even and unlabored.  Clear throughout to auscultation.   No wheezes, crackles, or rhonchi. No acute distress. Heart:  Regular rate and rhythm; no murmurs, clicks, rubs, or gallops. Abdomen:  Normal bowel sounds.  No bruits.  Soft, and non-distended without masses, hepatosplenomegaly or hernias noted.  No Tenderness.  No guarding or rebound tenderness.    Neurologic:  Alert and oriented x3;  grossly normal neurologically. Psych:  Alert and cooperative. Normal mood and affect.  Imaging Studies: No results found.  Assessment and Plan:   Bill Perez is a 52 y.o. y/o male has been referred for:   1.  Weight loss  Scheduling EGD and colonoscopy.  2.  Heme positive stool  Scheduling EGD and colonoscopy I  discussed risks of EGD and colonoscopy with patient to include risk of bleeding, colon perforation, and risk of sedation.  Patient expressed understanding and agrees to proceed with colonoscopy.   3.  Mild constipation Recommend High Fiber diet with fruits, vegetables, and whole grains. Drink 64 ounces of Fluids Daily. Start Miralax Mix 1 capful in a drink daily.  4.  Colon cancer screening  Scheduling Colonoscopy I discussed risks of colonoscopy with patient to include risk of bleeding, colon perforation, and risk of sedation.  Patient expressed understanding and agrees to proceed with colonoscopy.   Follow up as needed based on EGD/colonoscopy results and GI symptoms.  Celso Amy, PA-C

## 2023-03-02 ENCOUNTER — Encounter: Payer: Self-pay | Admitting: Physician Assistant

## 2023-03-02 ENCOUNTER — Other Ambulatory Visit: Payer: Self-pay

## 2023-03-02 ENCOUNTER — Ambulatory Visit (INDEPENDENT_AMBULATORY_CARE_PROVIDER_SITE_OTHER): Payer: No Typology Code available for payment source | Admitting: Physician Assistant

## 2023-03-02 VITALS — BP 125/79 | HR 74 | Temp 98.2°F | Ht 70.0 in | Wt 216.0 lb

## 2023-03-02 DIAGNOSIS — K5900 Constipation, unspecified: Secondary | ICD-10-CM

## 2023-03-02 DIAGNOSIS — R634 Abnormal weight loss: Secondary | ICD-10-CM | POA: Diagnosis not present

## 2023-03-02 DIAGNOSIS — Z1211 Encounter for screening for malignant neoplasm of colon: Secondary | ICD-10-CM

## 2023-03-02 DIAGNOSIS — R195 Other fecal abnormalities: Secondary | ICD-10-CM | POA: Diagnosis not present

## 2023-03-02 MED ORDER — PEG 3350-KCL-NA BICARB-NACL 420 G PO SOLR: 4000.0000 mL | Freq: Once | ORAL | 0 refills | Status: AC

## 2023-03-02 NOTE — Patient Instructions (Signed)
For Constipation: Recommend High Fiber diet with fruits, vegetables, and whole grains. Drink 64 ounces of Fluids Daily. Start Miralax Mix 1 capful in a drink daily.

## 2023-04-10 ENCOUNTER — Encounter: Payer: Self-pay | Admitting: Gastroenterology

## 2023-04-13 ENCOUNTER — Other Ambulatory Visit: Payer: Self-pay | Admitting: Nurse Practitioner

## 2023-04-13 NOTE — Telephone Encounter (Signed)
 Medication Refill -  Most Recent Primary Care Visit:  Provider: DANN KIRSCH  Department: [facility]  Visit Type: NURSE VISIT  Date: 01/22/2023  Medication:  folic acid  (FOLVITE ) 1 MG tablet   Has the patient contacted their pharmacy? Yes   Is this the correct pharmacy for this prescription? Yes  This is the patient's preferred pharmacy:  [id number] - [place], [place] - [address] AT [facility] OF [address] [place] [address] Phone: [phone] Fax: [phone]   Has the prescription been filled recently? Yes  Is the patient out of the medication? Yes  Has the patient been seen for an appointment in the last year OR does the patient have an upcoming appointment? Yes  Can we respond through MyChart? No  Agent: Please be advised that Rx refills may take up to 3 business days. We ask that you follow-up with your pharmacy.

## 2023-04-17 ENCOUNTER — Encounter: Payer: Self-pay | Admitting: Certified Registered"

## 2023-04-17 ENCOUNTER — Ambulatory Visit
Admission: RE | Admit: 2023-04-17 | Discharge: 2023-04-17 | Disposition: A | Discharge status: Home / Self Care | Payer: 59 | Attending: Gastroenterology | Admitting: Gastroenterology

## 2023-04-17 ENCOUNTER — Telehealth: Payer: Self-pay

## 2023-04-17 ENCOUNTER — Encounter
Admission: RE | Disposition: A | Discharge status: Home / Self Care | Payer: Self-pay | Source: Home / Self Care | Attending: Gastroenterology

## 2023-04-17 ENCOUNTER — Encounter: Payer: Self-pay | Admitting: Gastroenterology

## 2023-04-17 DIAGNOSIS — R195 Other fecal abnormalities: Secondary | ICD-10-CM

## 2023-04-17 DIAGNOSIS — R634 Abnormal weight loss: Secondary | ICD-10-CM | POA: Diagnosis present

## 2023-04-17 DIAGNOSIS — Z539 Procedure and treatment not carried out, unspecified reason: Secondary | ICD-10-CM | POA: Diagnosis not present

## 2023-04-17 DIAGNOSIS — K5900 Constipation, unspecified: Secondary | ICD-10-CM

## 2023-04-17 HISTORY — PX: ESOPHAGOGASTRODUODENOSCOPY (EGD) WITH PROPOFOL: SHX5813

## 2023-04-17 HISTORY — PX: COLONOSCOPY WITH PROPOFOL: SHX5780

## 2023-04-17 SURGERY — COLONOSCOPY WITH PROPOFOL
Anesthesia: General

## 2023-04-17 MED ORDER — SODIUM CHLORIDE 0.9 % IV SOLN: INTRAVENOUS | Status: DC

## 2023-04-17 MED ORDER — FOLIC ACID 1 MG PO TABS: 1.0000 mg | ORAL_TABLET | Freq: Every day | ORAL | 1 refills | Status: DC

## 2023-04-17 NOTE — Anesthesia Preprocedure Evaluation (Deleted)
 Anesthesia Evaluation    Airway Mallampati: III       Dental   Pulmonary Current Smoker          Cardiovascular hypertension,      Neuro/Psych  Neuromuscular disease (multiple sclerosis)    GI/Hepatic   Endo/Other  diabetes, Type 2    Renal/GU      Musculoskeletal   Abdominal   Peds  Hematology   Anesthesia Other Findings   Reproductive/Obstetrics                              Anesthesia Physical Anesthesia Plan  ASA: 2  Anesthesia Plan: General   Post-op Pain Management:    Induction: Intravenous  PONV Risk Score and Plan: 1 and Propofol  infusion, TIVA and Treatment may vary due to age or medical condition  Airway Management Planned: Natural Airway  Additional Equipment:   Intra-op Plan:   Post-operative Plan:   Informed Consent: I have reviewed the patients History and Physical, chart, labs and discussed the procedure including the risks, benefits and alternatives for the proposed anesthesia with the patient or authorized representative who has indicated his/her understanding and acceptance.       Plan Discussed with: CRNA  Anesthesia Plan Comments: (Anesthetic considerations for multiple sclerosis: closely monitor body temperature to avoid increase above baseline, avoid succinylcholine, pt may have altered sensitivity to NDMRs.   LMA/GETA backup discussed.  Patient consented for risks of anesthesia including but not limited to:  - adverse reactions to medications - damage to eyes, teeth, lips or other oral mucosa - nerve damage due to positioning  - sore throat or hoarseness - damage to heart, brain, nerves, lungs, other parts of body or loss of life  Informed patient about role of CRNA in peri- and intra-operative care.  Patient voiced understanding.)         Anesthesia Quick Evaluation

## 2023-04-17 NOTE — Telephone Encounter (Signed)
 The patient says that he did not know about the upper and his not prepped well. He is gaining weight again so only wants to be set up for the colon with a 2 day prep please.

## 2023-04-17 NOTE — OR Nursing (Signed)
 Pt ate the day before, prep not clean, still brown stool, will reschedule for another day.

## 2023-04-17 NOTE — Telephone Encounter (Signed)
 Requested Prescriptions  Pending Prescriptions Disp Refills   folic acid  (FOLVITE ) 1 MG tablet 100 tablet 1    Sig: Take 1 tablet (1 mg total) by mouth daily.     Endocrinology:  Vitamins Passed - 04/17/2023  9:25 AM      Passed - Valid encounter within last 12 months    Recent Outpatient Visits           3 months ago Hypertension, unspecified type   [facility] Gareth Mliss FALCON, FNP   9 months ago Hypertension, unspecified type   [facility] Gareth Mliss FALCON, FNP   1 year ago Dyslipidemia   [facility] Gareth Mliss FALCON, FNP   1 year ago Dyslipidemia   [facility] Gareth Mliss FALCON, FNP   2 years ago Hospital discharge follow-up   [facility] Gareth Mliss FALCON, FNP       Future Appointments             In 3 months Gareth, Mliss FALCON, FNP [facility], [facility]

## 2023-04-18 ENCOUNTER — Encounter: Payer: Self-pay | Admitting: Gastroenterology

## 2023-04-18 MED ORDER — NA SULFATE-K SULFATE-MG SULF 17.5-3.13-1.6 GM/177ML PO SOLN: 2.0000 | ORAL | 0 refills | Status: DC

## 2023-04-18 NOTE — Telephone Encounter (Signed)
 Pt has been scheduled for a colonoscopy with 2 day prep... Instructions released to Mychart and mailed to pt

## 2023-04-18 NOTE — Addendum Note (Signed)
 Addended by: Roena Malady on: 04/18/2023 10:04 AM   Modules accepted: Orders

## 2023-05-03 ENCOUNTER — Other Ambulatory Visit: Payer: Self-pay

## 2023-05-03 ENCOUNTER — Telehealth: Payer: Self-pay | Admitting: Physician Assistant

## 2023-05-03 DIAGNOSIS — R195 Other fecal abnormalities: Secondary | ICD-10-CM

## 2023-05-03 DIAGNOSIS — R634 Abnormal weight loss: Secondary | ICD-10-CM

## 2023-05-03 NOTE — Telephone Encounter (Signed)
Pt is scheduled for colonoscopy tomorrow and has been ordered... I spoke with Chip Boer at endo and she will be calling pt to give arrival time... No auth needed so nothing further needed at this time

## 2023-05-03 NOTE — Telephone Encounter (Signed)
The patient called in to because ENDO said that he is not on the schedule for tomorrow and is not on the schedule. In his letter it said that he has one 05/04/23. Please call patient.

## 2023-05-04 ENCOUNTER — Ambulatory Visit
Admission: RE | Admit: 2023-05-04 | Discharge: 2023-05-04 | Disposition: A | Discharge status: Home / Self Care | Payer: 59 | Attending: Gastroenterology | Admitting: Gastroenterology

## 2023-05-04 ENCOUNTER — Encounter: Payer: Self-pay | Admitting: Gastroenterology

## 2023-05-04 ENCOUNTER — Ambulatory Visit: Payer: 59 | Admitting: Registered Nurse

## 2023-05-04 ENCOUNTER — Encounter
Admission: RE | Disposition: A | Discharge status: Home / Self Care | Payer: Self-pay | Source: Home / Self Care | Attending: Gastroenterology

## 2023-05-04 DIAGNOSIS — Z833 Family history of diabetes mellitus: Secondary | ICD-10-CM | POA: Diagnosis not present

## 2023-05-04 DIAGNOSIS — I10 Essential (primary) hypertension: Secondary | ICD-10-CM | POA: Insufficient documentation

## 2023-05-04 DIAGNOSIS — K640 First degree hemorrhoids: Secondary | ICD-10-CM | POA: Insufficient documentation

## 2023-05-04 DIAGNOSIS — F17210 Nicotine dependence, cigarettes, uncomplicated: Secondary | ICD-10-CM | POA: Insufficient documentation

## 2023-05-04 DIAGNOSIS — K621 Rectal polyp: Secondary | ICD-10-CM | POA: Insufficient documentation

## 2023-05-04 DIAGNOSIS — R195 Other fecal abnormalities: Secondary | ICD-10-CM

## 2023-05-04 DIAGNOSIS — E119 Type 2 diabetes mellitus without complications: Secondary | ICD-10-CM | POA: Insufficient documentation

## 2023-05-04 DIAGNOSIS — Z8249 Family history of ischemic heart disease and other diseases of the circulatory system: Secondary | ICD-10-CM | POA: Diagnosis not present

## 2023-05-04 DIAGNOSIS — Z79899 Other long term (current) drug therapy: Secondary | ICD-10-CM | POA: Diagnosis not present

## 2023-05-04 DIAGNOSIS — E66813 Obesity, class 3: Secondary | ICD-10-CM | POA: Insufficient documentation

## 2023-05-04 DIAGNOSIS — K635 Polyp of colon: Secondary | ICD-10-CM

## 2023-05-04 DIAGNOSIS — Z6830 Body mass index (BMI) 30.0-30.9, adult: Secondary | ICD-10-CM | POA: Diagnosis not present

## 2023-05-04 DIAGNOSIS — R634 Abnormal weight loss: Secondary | ICD-10-CM

## 2023-05-04 HISTORY — PX: COLONOSCOPY WITH PROPOFOL: SHX5780

## 2023-05-04 HISTORY — PX: POLYPECTOMY: SHX5525

## 2023-05-04 SURGERY — COLONOSCOPY WITH PROPOFOL
Anesthesia: General

## 2023-05-04 MED ORDER — PROPOFOL 10 MG/ML IV BOLUS
INTRAVENOUS | Status: AC
Start: 2023-05-04 — End: ?
  Filled 2023-05-04: qty 20

## 2023-05-04 MED ORDER — LIDOCAINE HCL (CARDIAC) PF 100 MG/5ML IV SOSY
PREFILLED_SYRINGE | INTRAVENOUS | Status: DC | PRN
  Administered 2023-05-04: 50 mg via INTRAVENOUS

## 2023-05-04 MED ORDER — PROPOFOL 10 MG/ML IV BOLUS
INTRAVENOUS | Status: DC | PRN
  Administered 2023-05-04: 100 mg via INTRAVENOUS
  Administered 2023-05-04: 100 ug/kg/min via INTRAVENOUS

## 2023-05-04 MED ORDER — LIDOCAINE HCL (PF) 2 % IJ SOLN
INTRAMUSCULAR | Status: AC
Start: 2023-05-04 — End: ?
  Filled 2023-05-04: qty 5

## 2023-05-04 MED ORDER — SODIUM CHLORIDE 0.9 % IV SOLN: INTRAVENOUS | Status: DC

## 2023-05-04 NOTE — Transfer of Care (Signed)
Immediate Anesthesia Transfer of Care Note  Patient: Bill Perez  Procedure(s) Performed: COLONOSCOPY WITH PROPOFOL  Patient Location: Endoscopy Unit  Anesthesia Type:General  Level of Consciousness: drowsy and patient cooperative  Airway & Oxygen Therapy: Patient Spontanous Breathing  Post-op Assessment: Report given to RN  Post vital signs: Reviewed and stable  Last Vitals:  Vitals Value Taken Time  BP 102/64 05/04/23 1045  Temp 35.8 C 05/04/23 1045  Pulse 107 05/04/23 1045  Resp 20 05/04/23 1045  SpO2 99 % 05/04/23 1045  Vitals shown include unfiled device data.  Last Pain:  Vitals:   05/04/23 1045  TempSrc: Tympanic  PainSc: 0-No pain         Complications: No notable events documented.

## 2023-05-04 NOTE — Anesthesia Procedure Notes (Signed)
Procedure Name: MAC Date/Time: 05/04/2023 10:25 AM  Performed by: Lily Lovings, CRNAPre-anesthesia Checklist: Patient identified, Emergency Drugs available, Suction available and Patient being monitored Patient Re-evaluated:Patient Re-evaluated prior to induction Oxygen Delivery Method: Simple face mask Preoxygenation: Pre-oxygenation with 100% oxygen Induction Type: IV induction Comments: POM

## 2023-05-04 NOTE — Anesthesia Postprocedure Evaluation (Signed)
Anesthesia Post Note  Patient: Bill Perez  Procedure(s) Performed: COLONOSCOPY WITH PROPOFOL  Patient location during evaluation: PACU Anesthesia Type: General Level of consciousness: awake and awake and alert Pain management: satisfactory to patient Vital Signs Assessment: post-procedure vital signs reviewed and stable Respiratory status: spontaneous breathing Cardiovascular status: stable Anesthetic complications: no   No notable events documented.   Last Vitals:  Vitals:   05/04/23 1045 05/04/23 1053  BP: 102/64 123/85  Pulse: (!) 104 85  Resp: 19 (!) 22  Temp: (!) 35.8 C   SpO2: 97% 99%    Last Pain:  Vitals:   05/04/23 1053  TempSrc:   PainSc: 0-No pain                 VAN STAVEREN,Cher Egnor

## 2023-05-04 NOTE — Op Note (Signed)
[facility] Gastroenterology Patient Name: Bill Perez Procedure Date: 05/04/2023 9:59 AM MRN: [id number] Account #: [id number] Date of Birth: 10/29/1970 Admit Type: Outpatient Age: 53 Room: [facility] ENDO ROOM 4 Gender: Male Note Status: Finalized Instrument Name: Prentice Docker [id number] Procedure:             Colonoscopy Indications:           Weight loss Providers:             Midge Minium MD, MD Referring MD:          Rudolpho Sevin. Pender (Referring MD) Medicines:             Propofol per Anesthesia Complications:         No immediate complications. Procedure:             Pre-Anesthesia Assessment:                        - Prior to the procedure, a History and Physical was                         performed, and patient medications and allergies were                         reviewed. The patient's tolerance of previous                         anesthesia was also reviewed. The risks and benefits                         of the procedure and the sedation options and risks                         were discussed with the patient. All questions were                         answered, and informed consent was obtained. Prior                         Anticoagulants: The patient has taken no anticoagulant                         or antiplatelet agents. ASA Grade Assessment: II - A                         patient with mild systemic disease. After reviewing                         the risks and benefits, the patient was deemed in                         satisfactory condition to undergo the procedure.                        After obtaining informed consent, the colonoscope was                         passed under direct vision. Throughout the procedure,  the patient's blood pressure, pulse, and oxygen                         saturations were monitored continuously. The                         Colonoscope was introduced through the anus and                          advanced to the the cecum, identified by appendiceal                         orifice and ileocecal valve. The colonoscopy was                         performed without difficulty. The patient tolerated                         the procedure well. The quality of the bowel                         preparation was excellent. Findings:      The perianal and digital rectal examinations were normal.      A 4 mm polyp was found in the rectum. The polyp was sessile. The polyp       was removed with a cold snare. Resection and retrieval were complete.      Non-bleeding internal hemorrhoids were found during retroflexion. The       hemorrhoids were Grade I (internal hemorrhoids that do not prolapse). Impression:            - One 4 mm polyp in the rectum, removed with a cold                         snare. Resected and retrieved.                        - Non-bleeding internal hemorrhoids. Recommendation:        - Discharge patient to home.                        - Resume previous diet.                        - Continue present medications.                        - Await pathology results.                        - If the pathology report reveals adenomatous tissue,                         then repeat the colonoscopy for surveillance in 7                         years. Procedure Code(s):     --- Professional ---                        [phone], Colonoscopy, flexible; with removal of  tumor(s), polyp(s), or other lesion(s) by snare                         technique Diagnosis Code(s):     --- Professional ---                        R63.4, Abnormal weight loss                        D12.8, Benign neoplasm of rectum CPT copyright 2022 American Medical Association. All rights reserved. The codes documented in this report are preliminary and upon coder review may  be revised to meet current compliance requirements. Midge Minium MD, MD 05/04/2023 10:41:53 AM This report has been signed  electronically. Number of Addenda: 0 Note Initiated On: 05/04/2023 9:59 AM Scope Withdrawal Time: 0 hours 7 minutes 15 seconds  Total Procedure Duration: 0 hours 13 minutes 55 seconds  Estimated Blood Loss:  Estimated blood loss: none.      [facility]

## 2023-05-04 NOTE — Anesthesia Preprocedure Evaluation (Signed)
Anesthesia Evaluation  Patient identified by MRN, date of birth, ID band Patient awake    Reviewed: Allergy & Precautions, NPO status , Patient's Chart, lab work & pertinent test results  Airway Mallampati: II  TM Distance: >3 FB Neck ROM: full    Dental  (+) Teeth Intact   Pulmonary neg pulmonary ROS, Current Smoker and Patient abstained from smoking.   Pulmonary exam normal  + decreased breath sounds      Cardiovascular hypertension, Pt. on medications negative cardio ROS Normal cardiovascular exam Rhythm:Regular Rate:Normal     Neuro/Psych negative neurological ROS  negative psych ROS   GI/Hepatic negative GI ROS, Neg liver ROS,,,  Endo/Other  negative endocrine ROSdiabetes, Type 2, Oral Hypoglycemic Agents  Class 3 obesity  Renal/GU negative Renal ROS  negative genitourinary   Musculoskeletal   Abdominal  (+) + obese  Peds negative pediatric ROS (+)  Hematology negative hematology ROS (+)   Anesthesia Other Findings Past Medical History: No date: Hyperlipidemia No date: Hypertension No date: Multiple sclerosis (HCC)  Past Surgical History: 04/17/2023: COLONOSCOPY WITH PROPOFOL; N/A     Comment:  Procedure: COLONOSCOPY WITH PROPOFOL;  Surgeon: Midge Minium, MD;  Location: [facility] ENDOSCOPY;  Service:               Endoscopy;  Laterality: N/A; 04/17/2023: ESOPHAGOGASTRODUODENOSCOPY (EGD) WITH PROPOFOL; N/A     Comment:  Procedure: ESOPHAGOGASTRODUODENOSCOPY (EGD) WITH               PROPOFOL;  Surgeon: Midge Minium, MD;  Location: [facility]               ENDOSCOPY;  Service: Endoscopy;  Laterality: N/A;     Reproductive/Obstetrics negative OB ROS                             Anesthesia Physical Anesthesia Plan  ASA: 3  Anesthesia Plan: General   Post-op Pain Management:    Induction: Intravenous  PONV Risk Score and Plan: Propofol infusion and TIVA  Airway Management  Planned: Natural Airway and Nasal Cannula  Additional Equipment:   Intra-op Plan:   Post-operative Plan:   Informed Consent: I have reviewed the patients History and Physical, chart, labs and discussed the procedure including the risks, benefits and alternatives for the proposed anesthesia with the patient or authorized representative who has indicated his/her understanding and acceptance.     Dental Advisory Given  Plan Discussed with: CRNA  Anesthesia Plan Comments:        Anesthesia Quick Evaluation

## 2023-05-04 NOTE — H&P (Signed)
Midge Minium, MD [facility] [address] [place], [place] [address] Phone:[phone] Fax : [phone]  Primary Care Physician:  Berniece Salines, FNP Primary Gastroenterologist:  Dr. Servando Snare  Pre-Procedure History & Physical: HPI:  Bill Perez is a 53 y.o. male is here for an colonoscopy.   Past Medical History:  Diagnosis Date   Hyperlipidemia    Hypertension    Multiple sclerosis (HCC)     Past Surgical History:  Procedure Laterality Date   COLONOSCOPY WITH PROPOFOL N/A 04/17/2023   Procedure: COLONOSCOPY WITH PROPOFOL;  Surgeon: Midge Minium, MD;  Location: [facility] ENDOSCOPY;  Service: Endoscopy;  Laterality: N/A;   ESOPHAGOGASTRODUODENOSCOPY (EGD) WITH PROPOFOL N/A 04/17/2023   Procedure: ESOPHAGOGASTRODUODENOSCOPY (EGD) WITH PROPOFOL;  Surgeon: Midge Minium, MD;  Location: [facility] ENDOSCOPY;  Service: Endoscopy;  Laterality: N/A;    Prior to Admission medications   Medication Sig Start Date End Date Taking? Authorizing Provider  aspirin 81 MG tablet Take 81 mg by mouth daily.    [provider]  folic acid (FOLVITE) 1 MG tablet Take 1 tablet (1 mg total) by mouth daily. 04/17/23   Berniece Salines, FNP  gabapentin (NEURONTIN) 100 MG capsule Three tabs PO qHS for a week, then four for a week, then five. 02/22/23   Berniece Salines, FNP  KESIMPTA 20 MG/0.4ML SOAJ Inject into the skin.    [provider]  lisinopril (ZESTRIL) 2.5 MG tablet TAKE 1 TABLET(2.5 MG) BY MOUTH DAILY 01/16/23   Berniece Salines, FNP  Na Sulfate-K Sulfate-Mg Sulf 17.5-3.13-1.6 GM/177ML SOLN Take 2 kits by mouth as directed. 04/18/23   Midge Minium, MD  rosuvastatin (CRESTOR) 5 MG tablet Take 1 tablet (5 mg total) by mouth at bedtime. 01/16/23   Berniece Salines, FNP    Allergies as of 05/03/2023   (No Known Allergies)    Family History  Problem Relation Age of Onset   Hypertension Mother    Hyperlipidemia Mother    Stroke Father    Diabetes Father     Social History    Socioeconomic History   Marital status: Married    Spouse name: Annitta Needs   Number of children: 4   Years of education: 15   Highest education level: Some college, no degree  Occupational History   Not on file  Tobacco Use   Smoking status: Every Day    Current packs/day: 0.50    Average packs/day: 0.5 packs/day for 25.0 years (12.5 ttl pk-yrs)    Types: Cigarettes   Smokeless tobacco: Never  Vaping Use   Vaping status: Never Used  Substance and Sexual Activity   Alcohol use: Not Currently    Alcohol/week: 4.0 standard drinks of alcohol    Types: 4 Glasses of wine per week   Drug use: Not Currently    Types: Marijuana   Sexual activity: Yes    Partners: Female  Other Topics Concern   Not on file  Social History Narrative   Not on file   Social Drivers of Health   Financial Resource Strain: Low Risk  (09/20/2018)   Overall Financial Resource Strain (CARDIA)    Difficulty of Paying Living Expenses: Not hard at all  Food Insecurity: No Food Insecurity (09/20/2018)   Hunger Vital Sign    Worried About Running Out of Food in the Last Year: Never true    Ran Out of Food in the Last Year: Never true  Transportation Needs: Unknown (09/20/2018)   PRAPARE - Transportation  Lack of Transportation (Medical): Not on file    Lack of Transportation (Non-Medical): No  Physical Activity: Insufficiently Active (09/20/2018)   Exercise Vital Sign    Days of Exercise per Week: 2 days    Minutes of Exercise per Session: 30 min  Stress: No Stress Concern Present (09/20/2018)   [employer] of Occupational Health - Occupational Stress Questionnaire    Feeling of Stress : Only a little  Social Connections: Socially Integrated (09/20/2018)   Social Connection and Isolation Panel [NHANES]    Frequency of Communication with Friends and Family: More than three times a week    Frequency of Social Gatherings with Friends and Family: More than three times a week    Attends Religious  Services: 1 to 4 times per year    Active Member of [employer] or Organizations: Yes    Attends Engineer, structural: More than 4 times per year    Marital Status: Married  Catering manager Violence: Not At Risk (09/20/2018)   Humiliation, Afraid, Rape, and Kick questionnaire    Fear of Current or Ex-Partner: No    Emotionally Abused: No    Physically Abused: No    Sexually Abused: No    Review of Systems: See HPI, otherwise negative ROS  Physical Exam: There were no vitals taken for this visit. General:   Alert,  pleasant and cooperative in NAD Head:  Normocephalic and atraumatic. Neck:  Supple; no masses or thyromegaly. Lungs:  Clear throughout to auscultation.    Heart:  Regular rate and rhythm. Abdomen:  Soft, nontender and nondistended. Normal bowel sounds, without guarding, and without rebound.   Neurologic:  Alert and  oriented x4;  grossly normal neurologically.  Impression/Plan: DILAN NOVOSAD is here for an colonoscopy to be performed for weight loss  Risks, benefits, limitations, and alternatives regarding  colonoscopy have been reviewed with the patient.  Questions have been answered.  All parties agreeable.   Midge Minium, MD  05/04/2023, 10:00 AM

## 2023-05-07 ENCOUNTER — Encounter: Payer: Self-pay | Admitting: Gastroenterology

## 2023-05-07 LAB — SURGICAL PATHOLOGY

## 2023-05-10 DIAGNOSIS — R252 Cramp and spasm: Secondary | ICD-10-CM | POA: Insufficient documentation

## 2023-07-17 ENCOUNTER — Ambulatory Visit: Payer: BC Managed Care – PPO | Admitting: Nurse Practitioner

## 2023-07-17 NOTE — Progress Notes (Unsigned)
   There were no vitals taken for this visit.   Subjective:    Patient ID: Bill Perez, male    DOB: 1970-07-23, 53 y.o.   MRN: [id number]  HPI: Bill Perez is a 53 y.o. male  No chief complaint on file.   Discussed the use of AI scribe software for clinical note transcription with the patient, who gave verbal consent to proceed.  History of Present Illness          01/16/2023   10:40 AM 07/13/2022    9:00 AM 01/10/2022   12:53 PM  Depression screen PHQ 2/9  Decreased Interest 0 0 0  Down, Depressed, Hopeless 0 0 0  PHQ - 2 Score 0 0 0    Relevant past medical, surgical, family and social history reviewed and updated as indicated. Interim medical history since our last visit reviewed. Allergies and medications reviewed and updated.  Review of Systems  Per HPI unless specifically indicated above     Objective:    There were no vitals taken for this visit.  {Vitals History (Optional):23777} Wt Readings from Last 3 Encounters:  05/04/23 208 lb (94.3 kg)  04/17/23 216 lb (98 kg)  03/02/23 216 lb (98 kg)    Physical Exam Physical Exam    Results for orders placed or performed during the hospital encounter of 05/04/23  Surgical pathology   Collection Time: 05/04/23 12:00 AM  Result Value Ref Range   SURGICAL PATHOLOGY      SURGICAL PATHOLOGY [facility] [address], [address] [place], [place] [address] Telephone [phone] or [phone] Fax [phone]  REPORT OF SURGICAL PATHOLOGY   Accession #: [id number] Patient Name: Bill Perez, Bill Perez Visit # : [id number]  MRN: [id number] Physician: Midge Minium DOB/Age 05/05/1970 (Age: 18) Gender: M Collected Date: 05/04/2023 Received Date: 05/04/2023  FINAL DIAGNOSIS       1. Rectum, polyp(s), Cold snare :       - HYPERPLASTIC POLYP.      - NEGATIVE FOR DYSPLASIA AND MALIGNANCY.       DATE SIGNED OUT: 05/07/2023 ELECTRONIC SIGNATURE : Oneita Kras Md, Delice Bison , Pathologist,  Electronic Signature  MICROSCOPIC DESCRIPTION  CASE COMMENTS STAINS USED IN DIAGNOSIS: H&E    CLINICAL HISTORY  SPECIMEN(S) OBTAINED 1. Rectum, polyp(s), Cold Snare  SPECIMEN COMMENTS: SPECIMEN CLINICAL INFORMATION: 1. Loss of weight, HEME positive stools.  Colon polyp    Gross Description 1. Received in formalin is a 0.6 x 0.4  x 0.2 cm aggregate of two tan polypoid tissue fragments submitted entirely in block 1A..   (SB:kh 05/04/23)        Report signed out from the following location(s) [facility] [address], [place] [address] CLIA #: [id number]  [facility] [address] [place], [place] [address] CLIA #: [id number]    {Labs (Optional):23779}    Assessment & Plan:   Problem List Items Addressed This Visit   None    Assessment and Plan Assessment & Plan         Follow up plan: No follow-ups on file.

## 2023-07-18 ENCOUNTER — Encounter: Payer: Self-pay | Admitting: Nurse Practitioner

## 2023-07-18 ENCOUNTER — Ambulatory Visit: Payer: Self-pay | Admitting: Nurse Practitioner

## 2023-07-18 VITALS — BP 128/84 | HR 95 | Resp 18 | Ht 69.0 in | Wt 226.5 lb

## 2023-07-18 DIAGNOSIS — E785 Hyperlipidemia, unspecified: Secondary | ICD-10-CM

## 2023-07-18 DIAGNOSIS — G5712 Meralgia paresthetica, left lower limb: Secondary | ICD-10-CM

## 2023-07-18 DIAGNOSIS — G35 Multiple sclerosis: Secondary | ICD-10-CM | POA: Diagnosis not present

## 2023-07-18 DIAGNOSIS — E1169 Type 2 diabetes mellitus with other specified complication: Secondary | ICD-10-CM

## 2023-07-18 DIAGNOSIS — E1129 Type 2 diabetes mellitus with other diabetic kidney complication: Secondary | ICD-10-CM | POA: Diagnosis not present

## 2023-07-18 DIAGNOSIS — E66812 Obesity, class 2: Secondary | ICD-10-CM

## 2023-07-18 DIAGNOSIS — E1159 Type 2 diabetes mellitus with other circulatory complications: Secondary | ICD-10-CM

## 2023-07-18 DIAGNOSIS — I10 Essential (primary) hypertension: Secondary | ICD-10-CM

## 2023-07-18 DIAGNOSIS — Z6835 Body mass index (BMI) 35.0-35.9, adult: Secondary | ICD-10-CM

## 2023-07-18 DIAGNOSIS — R809 Proteinuria, unspecified: Secondary | ICD-10-CM

## 2023-07-18 MED ORDER — FOLIC ACID 1 MG PO TABS: 1.0000 mg | ORAL_TABLET | Freq: Every day | ORAL | 3 refills | Status: AC

## 2023-07-18 MED ORDER — ROSUVASTATIN CALCIUM 5 MG PO TABS: 5.0000 mg | ORAL_TABLET | Freq: Every day | ORAL | 3 refills | Status: AC

## 2023-07-18 MED ORDER — LISINOPRIL 2.5 MG PO TABS: ORAL_TABLET | ORAL | 3 refills | Status: DC

## 2023-07-19 ENCOUNTER — Encounter: Payer: Self-pay | Admitting: Nurse Practitioner

## 2023-07-19 LAB — COMPREHENSIVE METABOLIC PANEL WITH GFR
AG Ratio: 1.5 (calc) (ref 1.0–2.5)
ALT: 17 U/L (ref 9–46)
AST: 20 U/L (ref 10–35)
Albumin: 4.3 g/dL (ref 3.6–5.1)
Alkaline phosphatase (APISO): 97 U/L (ref 35–144)
BUN: 10 mg/dL (ref 7–25)
CO2: 30 mmol/L (ref 20–32)
Calcium: 9.2 mg/dL (ref 8.6–10.3)
Chloride: 106 mmol/L (ref 98–110)
Creat: 1.08 mg/dL (ref 0.70–1.30)
Globulin: 2.9 g/dL (ref 1.9–3.7)
Glucose, Bld: 80 mg/dL (ref 65–99)
Potassium: 4.6 mmol/L (ref 3.5–5.3)
Sodium: 141 mmol/L (ref 135–146)
Total Bilirubin: 0.8 mg/dL (ref 0.2–1.2)
Total Protein: 7.2 g/dL (ref 6.1–8.1)
eGFR: 82 mL/min/{1.73_m2} (ref >=60)

## 2023-07-19 LAB — HEMOGLOBIN A1C
Hgb A1c MFr Bld: 5.1 %{Hb} (ref <5.7)
Mean Plasma Glucose: 100 mg/dL
eAG (mmol/L): 5.5 mmol/L

## 2023-07-19 LAB — CBC WITH DIFFERENTIAL/PLATELET
Absolute Lymphocytes: 1845 {cells}/uL (ref 850–3900)
Absolute Monocytes: 549 {cells}/uL (ref 200–950)
Basophils Absolute: 57 {cells}/uL (ref 0–200)
Basophils Relative: 0.7 %
Eosinophils Absolute: 164 {cells}/uL (ref 15–500)
Eosinophils Relative: 2 %
HCT: 49 % (ref 38.5–50.0)
Hemoglobin: 16 g/dL (ref 13.2–17.1)
MCH: 30.2 pg (ref 27.0–33.0)
MCHC: 32.7 g/dL (ref 32.0–36.0)
MCV: 92.6 fL (ref 80.0–100.0)
MPV: 10.1 fL (ref 7.5–12.5)
Monocytes Relative: 6.7 %
Neutro Abs: 5584 {cells}/uL (ref 1500–7800)
Neutrophils Relative %: 68.1 %
Platelets: 353 10*3/uL (ref 140–400)
RBC: 5.29 10*6/uL (ref 4.20–5.80)
RDW: 11.6 % (ref 11.0–15.0)
Total Lymphocyte: 22.5 %
WBC: 8.2 10*3/uL (ref 3.8–10.8)

## 2023-07-19 LAB — LIPID PANEL
Cholesterol: 142 mg/dL (ref <200)
HDL: 33 mg/dL — ABNORMAL LOW (ref >=40)
LDL Cholesterol (Calc): 88 mg/dL
Non-HDL Cholesterol (Calc): 109 mg/dL (ref <130)
Total CHOL/HDL Ratio: 4.3 (calc) (ref <5.0)
Triglycerides: 114 mg/dL (ref <150)

## 2023-12-17 ENCOUNTER — Other Ambulatory Visit: Payer: Self-pay | Admitting: Physician Assistant

## 2023-12-17 DIAGNOSIS — G35 Multiple sclerosis: Secondary | ICD-10-CM

## 2024-01-16 NOTE — Progress Notes (Deleted)
 There were no vitals taken for this visit.   Subjective:    Patient ID: Bill Perez, male    DOB: 1971-02-08, 53 y.o.   MRN: [id number]  HPI: Bill Perez is a 53 y.o. male with a history of hypertension, type 2 diabetes mellitus, multiple sclerosis, dyslipidemia, and obesity who presents today for a 6 month follow-up for chronic medical management. He is taking lisinopril  2.5 mg daily for his hypertension. He endorses checking his blood pressure occasionally at home with readings around   For  His MS is managed with Kesimpta injections. For his dyslipidemia he is taking rosuvastatin  5 mg daily. He is also working on limiting saturated fats in diet and incorporating exercise, such as            07/18/2023   11:02 AM 01/16/2023   10:40 AM 07/13/2022    9:00 AM  Depression screen PHQ 2/9  Decreased Interest 0 0 0  Down, Depressed, Hopeless 0 0 0  PHQ - 2 Score 0 0 0  Altered sleeping 0    Tired, decreased energy 0    Change in appetite 0    Feeling bad or failure about yourself  0    Trouble concentrating 0    Moving slowly or fidgety/restless 0    Suicidal thoughts 0    PHQ-9 Score 0    Difficult doing work/chores Not difficult at all      Relevant past medical, surgical, family and social history reviewed and updated as indicated. Interim medical history since our last visit reviewed. Allergies and medications reviewed and updated.  Review of Systems  Per HPI unless specifically indicated above     Objective:     There were no vitals taken for this visit.  {Vitals History (Optional):23777} Wt Readings from Last 3 Encounters:  07/18/23 226 lb 8 oz (102.7 kg)  05/04/23 208 lb (94.3 kg)  04/17/23 216 lb (98 kg)    Physical Exam   Results for orders placed or performed in visit on 07/18/23  CBC with Differential/Platelet   Collection Time: 07/18/23 11:29 AM  Result Value Ref Range   WBC 8.2 3.8 - 10.8 Thousand/uL   RBC 5.29 4.20 - 5.80 Million/uL    Hemoglobin 16.0 13.2 - 17.1 g/dL   HCT 50.9 61.4 - 49.9 %   MCV 92.6 80.0 - 100.0 fL   MCH 30.2 27.0 - 33.0 pg   MCHC 32.7 32.0 - 36.0 g/dL   RDW 88.3 88.9 - 84.9 %   Platelets 353 140 - 400 Thousand/uL   MPV 10.1 7.5 - 12.5 fL   Neutro Abs 5,584 1,500 - 7,800 cells/uL   Absolute Lymphocytes 1,845 850 - 3,900 cells/uL   Absolute Monocytes 549 200 - 950 cells/uL   Eosinophils Absolute 164 15 - 500 cells/uL   Basophils Absolute 57 0 - 200 cells/uL   Neutrophils Relative % 68.1 %   Total Lymphocyte 22.5 %   Monocytes Relative 6.7 %   Eosinophils Relative 2.0 %   Basophils Relative 0.7 %  Comprehensive metabolic panel with GFR   Collection Time: 07/18/23 11:29 AM  Result Value Ref Range   Glucose, Bld 80 65 - 99 mg/dL   BUN 10 7 - 25 mg/dL   Creat 8.91 9.29 - 8.69 mg/dL   eGFR 82 > OR = 60 fO/fpw/8.26f7   BUN/Creatinine Ratio SEE NOTE: 6 - 22 (calc)   Sodium 141 135 - 146 mmol/L   Potassium 4.6 3.5 -  5.3 mmol/L   Chloride 106 98 - 110 mmol/L   CO2 30 20 - 32 mmol/L   Calcium  9.2 8.6 - 10.3 mg/dL   Total Protein 7.2 6.1 - 8.1 g/dL   Albumin 4.3 3.6 - 5.1 g/dL   Globulin 2.9 1.9 - 3.7 g/dL (calc)   AG Ratio 1.5 1.0 - 2.5 (calc)   Total Bilirubin 0.8 0.2 - 1.2 mg/dL   Alkaline phosphatase (APISO) 97 35 - 144 U/L   AST 20 10 - 35 U/L   ALT 17 9 - 46 U/L  Lipid panel   Collection Time: 07/18/23 11:29 AM  Result Value Ref Range   Cholesterol 142 <200 mg/dL   HDL 33 (L) > OR = 40 mg/dL   Triglycerides 885 <849 mg/dL   LDL Cholesterol (Calc) 88 mg/dL (calc)   Total CHOL/HDL Ratio 4.3 <5.0 (calc)   Non-HDL Cholesterol (Calc) 109 <130 mg/dL (calc)  Hemoglobin J8r   Collection Time: 07/18/23 11:29 AM  Result Value Ref Range   Hgb A1c MFr Bld 5.1 <5.7 % of total Hgb   Mean Plasma Glucose 100 mg/dL   eAG (mmol/L) 5.5 mmol/L   {Labs (Optional):23779}       Assessment & Plan:   Problem List Items Addressed This Visit   None    Assessment and Plan         Follow up  plan: No follow-ups on file.

## 2024-01-17 ENCOUNTER — Ambulatory Visit: Admitting: Nurse Practitioner

## 2024-01-17 DIAGNOSIS — E1129 Type 2 diabetes mellitus with other diabetic kidney complication: Secondary | ICD-10-CM

## 2024-01-17 DIAGNOSIS — Z6835 Body mass index (BMI) 35.0-35.9, adult: Secondary | ICD-10-CM

## 2024-01-17 DIAGNOSIS — I10 Essential (primary) hypertension: Secondary | ICD-10-CM

## 2024-01-17 DIAGNOSIS — E785 Hyperlipidemia, unspecified: Secondary | ICD-10-CM

## 2024-01-17 DIAGNOSIS — G35D Multiple sclerosis, unspecified: Secondary | ICD-10-CM

## 2024-01-30 ENCOUNTER — Encounter: Payer: Self-pay | Admitting: Nurse Practitioner

## 2024-01-30 ENCOUNTER — Ambulatory Visit: Admitting: Nurse Practitioner

## 2024-01-30 VITALS — BP 118/84 | HR 96 | Temp 98.7°F | Ht 69.0 in | Wt 236.1 lb

## 2024-01-30 DIAGNOSIS — Z130 Encounter for screening for diseases of the blood and blood-forming organs and certain disorders involving the immune mechanism: Secondary | ICD-10-CM | POA: Diagnosis not present

## 2024-01-30 DIAGNOSIS — I10 Essential (primary) hypertension: Secondary | ICD-10-CM

## 2024-01-30 DIAGNOSIS — Z6835 Body mass index (BMI) 35.0-35.9, adult: Secondary | ICD-10-CM

## 2024-01-30 DIAGNOSIS — E66812 Obesity, class 2: Secondary | ICD-10-CM

## 2024-01-30 DIAGNOSIS — E785 Hyperlipidemia, unspecified: Secondary | ICD-10-CM

## 2024-01-30 DIAGNOSIS — E1129 Type 2 diabetes mellitus with other diabetic kidney complication: Secondary | ICD-10-CM | POA: Diagnosis not present

## 2024-01-30 DIAGNOSIS — G35D Multiple sclerosis, unspecified: Secondary | ICD-10-CM

## 2024-01-30 DIAGNOSIS — Z23 Encounter for immunization: Secondary | ICD-10-CM

## 2024-01-30 DIAGNOSIS — R809 Proteinuria, unspecified: Secondary | ICD-10-CM

## 2024-01-30 NOTE — Assessment & Plan Note (Signed)
 Continue taking Rosuvastatin  5 mg daily. Labs ordered.

## 2024-01-30 NOTE — Assessment & Plan Note (Signed)
 Does not currently take any anti-diabetic medications. Managed with diet and exercise. Educated on consuming healthy diet with a variety of vegetables, fruits, and whole grains. Perform 150 minutes of moderate intensity exercise with 2 days of weight bearing exercises. Diabetic foot exam completed and normal. Labs ordered.

## 2024-01-30 NOTE — Progress Notes (Signed)
 BP 118/84   Pulse 96   Temp 98.7 F (37.1 C)   Ht 5' 9 (1.753 m)   Wt 236 lb 1.6 oz (107.1 kg)   SpO2 98%   BMI 34.87 kg/m    Subjective:    Patient ID: Bill Perez, male    DOB: [birth year], 53 y.o.   MRN: [id number]  HPI: Bill Perez is a 53 y.o. male  Chief Complaint  Patient presents with   Medical Management of Chronic Issues   Hyperlipidemia   Hypertension   Hypertension: -Medications: Lisinopril  2.5 mg daily -Patient is compliant with above medications and reports no side effects. -Does not check blood pressure at home. -Denies any SOB, CP, vision changes, LE edema or symptoms of hypotension -Diet: Regular diet. Acknowledges that needs to eat healthier diet. -Exercise: Was in physical therapy for 2 years, hasn't been in 2 months. Occasionally walks.   Multiple Sclerosis: -Medications: Kesimpta 20 mg injections, Folic acid  1 mg daily -Patient is compliant with above medications and reports no side effects. -Denies any tingling, balance, or coordination problems. -Endorses intermittent weakness, numbness under toes, and itching specifically with flare-ups.  Diabetes, Type 2: -Last A1c: 5.1 (07/18/2023); Will check A1c during this visit. -Medications: Does not currently take any anti-diabetic medications. -Does not check blood glucose at home. -Eye exam: Reports having eye exam 2 weeks ago. -Foot exam: Completed during this visit (01/30/2024) -Microalbumin: Completed 01/16/2023; Will complete during this visit. -Statin: Rosuvastatin  5 mg daily  -PNA vaccine: Declined. -Denies symptoms of hypoglycemia, polyuria, polydipsia, numbness extremities, foot ulcers/trauma.   Obesity: Wt Readings from Last 3 Encounters:  01/30/24 236 lb 1.6 oz (107.1 kg)  07/18/23 226 lb 8 oz (102.7 kg)  05/04/23 208 lb (94.3 kg)   Body mass index is 34.87 kg/m.   - Encourage continuation of lifestyle modifications, including dietary management and regular  exercise. -continue to increase physical activity, getting at least 150 min of physical activity a week.  Work on including Runner, broadcasting/film/video 2 days a week.  - continue eating at a calorie deficit 1800-2000 cal a day, eating a well balanced diet with whole foods, avoiding processed foods.   Patient is motivated to continue working on lifestyle modification.    HLD: -Medications: Rosuvastatin  5 mg daily -Patient is compliant with above medications and reports no side effects.  -Last lipid panel: 07/18/2023  Lipid Panel     Component Value Date/Time   CHOL 142 07/18/2023 1129   TRIG 114 07/18/2023 1129   HDL 33 (L) 07/18/2023 1129   CHOLHDL 4.3 07/18/2023 1129   VLDL 22 09/13/2016 1031   LDLCALC 88 07/18/2023 1129    PHQ-9 is negative for depression.    01/30/2024    9:44 AM 07/18/2023   11:02 AM 01/16/2023   10:40 AM  Depression screen PHQ 2/9  Decreased Interest 0 0 0  Down, Depressed, Hopeless 0 0 0  PHQ - 2 Score 0 0 0  Altered sleeping 0 0   Tired, decreased energy 0 0   Change in appetite 0 0   Feeling bad or failure about yourself  0 0   Trouble concentrating 0 0   Moving slowly or fidgety/restless 0 0   Suicidal thoughts 0 0   PHQ-9 Score 0 0   Difficult doing work/chores Not difficult at all Not difficult at all     Relevant past medical, surgical, family and social history reviewed and updated as indicated. Interim medical history since our  last visit reviewed. Allergies and medications reviewed and updated.  Review of Systems  Per HPI unless specifically indicated above Constitutional: Negative for fever or weight change.  Respiratory: Negative for cough and shortness of breath.   Cardiovascular: Negative for chest pain or palpitations.  Gastrointestinal: Negative for abdominal pain, no bowel changes.  Musculoskeletal: Negative for gait problem or joint swelling.  Skin: Negative for rash.  Neurological: Negative for dizziness or headache.  No other specific  complaints in a complete review of systems (except as listed in HPI above).     Objective:     BP 118/84   Pulse 96   Temp 98.7 F (37.1 C)   Ht 5' 9 (1.753 m)   Wt 236 lb 1.6 oz (107.1 kg)   SpO2 98%   BMI 34.87 kg/m    Wt Readings from Last 3 Encounters:  01/30/24 236 lb 1.6 oz (107.1 kg)  07/18/23 226 lb 8 oz (102.7 kg)  05/04/23 208 lb (94.3 kg)      Physical Exam Constitutional:      Appearance: Normal appearance.  Cardiovascular:     Rate and Rhythm: Normal rate and regular rhythm.     Pulses: Normal pulses.     Heart sounds: Normal heart sounds.  Pulmonary:     Effort: Pulmonary effort is normal.     Breath sounds: Normal breath sounds.  Skin:    General: Skin is warm and dry.  Neurological:     General: No focal deficit present.     Mental Status: He is alert and oriented to person, place, and time. Mental status is at baseline.  Psychiatric:        Mood and Affect: Mood normal.        Behavior: Behavior normal.        Thought Content: Thought content normal.        Judgment: Judgment normal.    Diabetic Foot Exam - Simple   Simple Foot Form Diabetic Foot exam was performed with the following findings: Yes 01/30/2024 10:00 AM  Visual Inspection No deformities, no ulcerations, no other skin breakdown bilaterally: Yes Sensation Testing Intact to touch and monofilament testing bilaterally: Yes Pulse Check Posterior Tibialis and Dorsalis pulse intact bilaterally: Yes Comments      Results for orders placed or performed in visit on 07/18/23  CBC with Differential/Platelet   Collection Time: 07/18/23 11:29 AM  Result Value Ref Range   WBC 8.2 3.8 - 10.8 Thousand/uL   RBC 5.29 4.20 - 5.80 Million/uL   Hemoglobin 16.0 13.2 - 17.1 g/dL   HCT 50.9 61.4 - 49.9 %   MCV 92.6 80.0 - 100.0 fL   MCH 30.2 27.0 - 33.0 pg   MCHC 32.7 32.0 - 36.0 g/dL   RDW 88.3 88.9 - 84.9 %   Platelets 353 140 - 400 Thousand/uL   MPV 10.1 7.5 - 12.5 fL   Neutro Abs  5,584 1,500 - 7,800 cells/uL   Absolute Lymphocytes 1,845 850 - 3,900 cells/uL   Absolute Monocytes 549 200 - 950 cells/uL   Eosinophils Absolute 164 15 - 500 cells/uL   Basophils Absolute 57 0 - 200 cells/uL   Neutrophils Relative % 68.1 %   Total Lymphocyte 22.5 %   Monocytes Relative 6.7 %   Eosinophils Relative 2.0 %   Basophils Relative 0.7 %  Comprehensive metabolic panel with GFR   Collection Time: 07/18/23 11:29 AM  Result Value Ref Range   Glucose, Bld 80 65 - 99  mg/dL   BUN 10 7 - 25 mg/dL   Creat 8.91 9.29 - 8.69 mg/dL   eGFR 82 > OR = 60 fO/fpw/8.26f7   BUN/Creatinine Ratio SEE NOTE: 6 - 22 (calc)   Sodium 141 135 - 146 mmol/L   Potassium 4.6 3.5 - 5.3 mmol/L   Chloride 106 98 - 110 mmol/L   CO2 30 20 - 32 mmol/L   Calcium  9.2 8.6 - 10.3 mg/dL   Total Protein 7.2 6.1 - 8.1 g/dL   Albumin 4.3 3.6 - 5.1 g/dL   Globulin 2.9 1.9 - 3.7 g/dL (calc)   AG Ratio 1.5 1.0 - 2.5 (calc)   Total Bilirubin 0.8 0.2 - 1.2 mg/dL   Alkaline phosphatase (APISO) 97 35 - 144 U/L   AST 20 10 - 35 U/L   ALT 17 9 - 46 U/L  Lipid panel   Collection Time: 07/18/23 11:29 AM  Result Value Ref Range   Cholesterol 142 <200 mg/dL   HDL 33 (L) > OR = 40 mg/dL   Triglycerides 885 <849 mg/dL   LDL Cholesterol (Calc) 88 mg/dL (calc)   Total CHOL/HDL Ratio 4.3 <5.0 (calc)   Non-HDL Cholesterol (Calc) 109 <130 mg/dL (calc)  Hemoglobin J8r   Collection Time: 07/18/23 11:29 AM  Result Value Ref Range   Hgb A1c MFr Bld 5.1 <5.7 % of total Hgb   Mean Plasma Glucose 100 mg/dL   eAG (mmol/L) 5.5 mmol/L          Assessment & Plan:   Problem List Items Addressed This Visit       Cardiovascular and Mediastinum   Hypertension   Continue taking Lisinopril  2.5 mg daily. Blood pressure in normal range. Labs ordered.        Endocrine   Type 2 diabetes mellitus with microalbuminuria, without long-term current use of insulin  (HCC)   Does not currently take any anti-diabetic medications.  Managed with diet and exercise. Educated on consuming healthy diet with a variety of vegetables, fruits, and whole grains. Perform 150 minutes of moderate intensity exercise with 2 days of weight bearing exercises. Diabetic foot exam completed and normal. Labs ordered.       Relevant Orders   Comprehensive metabolic panel with GFR   Hemoglobin A1c   Microalbumin / creatinine urine ratio   HM Diabetes Foot Exam (Completed)     Nervous and Auditory   MS (multiple sclerosis)   Takes Kesimpta 20 mg injections and Folic acid  1 mg daily. Denies any recent flare-ups.        Other   Dyslipidemia   Continue taking Rosuvastatin  5 mg daily. Labs ordered.      Relevant Orders   Lipid panel   Class 2 severe obesity due to excess calories with serious comorbidity and body mass index (BMI) of 35.0 to 35.9 in adult   - Encourage continuation of lifestyle modifications, including dietary management and regular exercise. -continue to increase physical activity, getting at least 150 min of physical activity a week.  Work on including Runner, broadcasting/film/video 2 days a week.  - continue eating at a calorie deficit 1800-2000 cal a day, eating a well balanced diet with whole foods, avoiding processed foods.   Patient is motivated to continue working on lifestyle modification.        Other Visit Diagnoses       Need for influenza vaccination    -  Primary     Screening for deficiency anemia  Labs ordered.   Relevant Orders   CBC with Differential/Platelet            Follow up plan: Return in about 6 months (around 07/30/2024).   I have reviewed this encounter including the documentation in this note and/or discussed this patient with the provider, Alexa Everhart SNP, I am certifying that I agree with the content of this note as supervising/preceptor nurse practitioner.  Mliss Spray, FNP-C [facility] 01/30/2024, 12:11 PM

## 2024-01-30 NOTE — Assessment & Plan Note (Signed)
 Continue taking Lisinopril  2.5 mg daily. Blood pressure in normal range. Labs ordered.

## 2024-01-30 NOTE — Assessment & Plan Note (Signed)
 Takes Kesimpta 20 mg injections and Folic acid  1 mg daily. Denies any recent flare-ups.

## 2024-01-30 NOTE — Assessment & Plan Note (Signed)
-   Encourage continuation of lifestyle modifications, including dietary management and regular exercise. -continue to increase physical activity, getting at least 150 min of physical activity a week.  Work on including Runner, broadcasting/film/video 2 days a week.  - continue eating at a calorie deficit 1800-2000 cal a day, eating a well balanced diet with whole foods, avoiding processed foods.   Patient is motivated to continue working on lifestyle modification.

## 2024-01-31 ENCOUNTER — Ambulatory Visit: Payer: Self-pay | Admitting: Nurse Practitioner

## 2024-01-31 ENCOUNTER — Other Ambulatory Visit: Payer: Self-pay | Admitting: Nurse Practitioner

## 2024-01-31 DIAGNOSIS — I10 Essential (primary) hypertension: Secondary | ICD-10-CM

## 2024-01-31 DIAGNOSIS — E1129 Type 2 diabetes mellitus with other diabetic kidney complication: Secondary | ICD-10-CM

## 2024-01-31 LAB — CBC WITH DIFFERENTIAL/PLATELET
Absolute Lymphocytes: 1771 {cells}/uL (ref 850–3900)
Absolute Monocytes: 570 {cells}/uL (ref 200–950)
Basophils Absolute: 39 {cells}/uL (ref 0–200)
Basophils Relative: 0.5 %
Eosinophils Absolute: 193 {cells}/uL (ref 15–500)
Eosinophils Relative: 2.5 %
HCT: 49 % (ref 38.5–50.0)
Hemoglobin: 15.5 g/dL (ref 13.2–17.1)
MCH: 29.5 pg (ref 27.0–33.0)
MCHC: 31.6 g/dL — ABNORMAL LOW (ref 32.0–36.0)
MCV: 93.2 fL (ref 80.0–100.0)
MPV: 9.9 fL (ref 7.5–12.5)
Monocytes Relative: 7.4 %
Neutro Abs: 5128 {cells}/uL (ref 1500–7800)
Neutrophils Relative %: 66.6 %
Platelets: 370 Thousand/uL (ref 140–400)
RBC: 5.26 Million/uL (ref 4.20–5.80)
RDW: 11.8 % (ref 11.0–15.0)
Total Lymphocyte: 23 %
WBC: 7.7 Thousand/uL (ref 3.8–10.8)

## 2024-01-31 LAB — LIPID PANEL
Cholesterol: 136 mg/dL (ref <200)
HDL: 27 mg/dL — ABNORMAL LOW (ref >=40)
LDL Cholesterol (Calc): 84 mg/dL
Non-HDL Cholesterol (Calc): 109 mg/dL (ref <130)
Total CHOL/HDL Ratio: 5 (calc) — ABNORMAL HIGH (ref <5.0)
Triglycerides: 146 mg/dL (ref <150)

## 2024-01-31 LAB — COMPREHENSIVE METABOLIC PANEL WITH GFR
AG Ratio: 1.6 (calc) (ref 1.0–2.5)
ALT: 19 U/L (ref 9–46)
AST: 19 U/L (ref 10–35)
Albumin: 4.4 g/dL (ref 3.6–5.1)
Alkaline phosphatase (APISO): 92 U/L (ref 35–144)
BUN: 17 mg/dL (ref 7–25)
CO2: 29 mmol/L (ref 20–32)
Calcium: 9.4 mg/dL (ref 8.6–10.3)
Chloride: 106 mmol/L (ref 98–110)
Creat: 1.24 mg/dL (ref 0.70–1.30)
Globulin: 2.7 g/dL (ref 1.9–3.7)
Glucose, Bld: 92 mg/dL (ref 65–99)
Potassium: 5.2 mmol/L (ref 3.5–5.3)
Sodium: 142 mmol/L (ref 135–146)
Total Bilirubin: 0.8 mg/dL (ref 0.2–1.2)
Total Protein: 7.1 g/dL (ref 6.1–8.1)
eGFR: 70 mL/min/1.73m2 (ref >=60)

## 2024-01-31 LAB — HEMOGLOBIN A1C
Hgb A1c MFr Bld: 5.2 % (ref <5.7)
Mean Plasma Glucose: 103 mg/dL
eAG (mmol/L): 5.7 mmol/L

## 2024-01-31 LAB — MICROALBUMIN / CREATININE URINE RATIO
Creatinine, Urine: 183 mg/dL (ref 20–320)
Microalb Creat Ratio: 64 mg/g{creat} — ABNORMAL HIGH (ref <30)
Microalb, Ur: 11.7 mg/dL

## 2024-01-31 MED ORDER — LISINOPRIL 5 MG PO TABS: 5.0000 mg | ORAL_TABLET | Freq: Every day | ORAL | 1 refills | Status: AC

## 2024-03-14 ENCOUNTER — Ambulatory Visit
Admission: RE | Admit: 2024-03-14 | Discharge: 2024-03-14 | Disposition: A | Source: Ambulatory Visit | Attending: Physician Assistant

## 2024-03-14 DIAGNOSIS — G35D Multiple sclerosis, unspecified: Secondary | ICD-10-CM

## 2024-03-14 MED ORDER — GADOBUTROL 1 MMOL/ML IV SOLN
10.0000 mL | Freq: Once | INTRAVENOUS | Status: AC | PRN
  Administered 2024-03-14: 10 mL via INTRAVENOUS

## 2024-07-30 ENCOUNTER — Ambulatory Visit: Admitting: Nurse Practitioner
